# Patient Record
Sex: Male | Born: 1965 | Race: Black or African American | Hispanic: No | Smoking: Current some day smoker
Health system: Southern US, Community
[De-identification: ages and names within clinical notes are randomized; demographics above are authoritative.]

## PROBLEM LIST (undated history)

## (undated) DIAGNOSIS — F4310 Post-traumatic stress disorder, unspecified: Secondary | ICD-10-CM

## (undated) DIAGNOSIS — G8929 Other chronic pain: Secondary | ICD-10-CM

## (undated) DIAGNOSIS — I10 Essential (primary) hypertension: Secondary | ICD-10-CM

## (undated) DIAGNOSIS — M549 Dorsalgia, unspecified: Secondary | ICD-10-CM

## (undated) DIAGNOSIS — E119 Type 2 diabetes mellitus without complications: Secondary | ICD-10-CM

## (undated) HISTORY — PX: SHOULDER SURGERY: SHX246

## (undated) HISTORY — PX: KNEE SURGERY: SHX244

## (undated) HISTORY — PX: APPENDECTOMY: SHX54

---

## 2001-12-03 ENCOUNTER — Encounter: Payer: Self-pay | Admitting: Internal Medicine

## 2001-12-03 ENCOUNTER — Ambulatory Visit (HOSPITAL_COMMUNITY): Admission: RE | Admit: 2001-12-03 | Discharge: 2001-12-03 | Payer: Self-pay | Admitting: Internal Medicine

## 2005-09-06 ENCOUNTER — Emergency Department (HOSPITAL_COMMUNITY): Admission: EM | Admit: 2005-09-06 | Discharge: 2005-09-06 | Payer: Self-pay | Admitting: Emergency Medicine

## 2005-11-13 ENCOUNTER — Ambulatory Visit: Payer: Self-pay | Admitting: Cardiology

## 2005-11-16 ENCOUNTER — Ambulatory Visit: Payer: Self-pay

## 2005-11-16 ENCOUNTER — Ambulatory Visit: Payer: Self-pay | Admitting: Cardiology

## 2005-12-01 ENCOUNTER — Emergency Department (HOSPITAL_COMMUNITY): Admission: EM | Admit: 2005-12-01 | Discharge: 2005-12-01 | Payer: Self-pay | Admitting: Emergency Medicine

## 2005-12-20 ENCOUNTER — Ambulatory Visit: Payer: Self-pay | Admitting: Cardiology

## 2007-07-23 ENCOUNTER — Emergency Department (HOSPITAL_COMMUNITY): Admission: EM | Admit: 2007-07-23 | Discharge: 2007-07-23 | Payer: Self-pay | Admitting: Emergency Medicine

## 2009-06-18 ENCOUNTER — Emergency Department (HOSPITAL_COMMUNITY)
Admission: EM | Admit: 2009-06-18 | Discharge: 2009-06-18 | Payer: Self-pay | Source: Home / Self Care | Admitting: Emergency Medicine

## 2009-06-22 ENCOUNTER — Emergency Department (HOSPITAL_COMMUNITY)
Admission: EM | Admit: 2009-06-22 | Discharge: 2009-06-22 | Payer: Self-pay | Source: Home / Self Care | Admitting: Emergency Medicine

## 2009-08-19 ENCOUNTER — Emergency Department (HOSPITAL_COMMUNITY): Admission: EM | Admit: 2009-08-19 | Discharge: 2009-08-19 | Payer: Self-pay | Admitting: Emergency Medicine

## 2010-08-19 NOTE — Assessment & Plan Note (Signed)
[facility]                              CARDIOLOGY OFFICE NOTE   NAME:[name], [name]                      MRN:          [id number]  DATE:11/13/2005                            DOB:          03-30-1966    REFERRING PHYSICIAN:  Kirk Ruths, MD   REASON FOR VISIT:  Palpitations and chest pain.   HISTORY OF PRESENT ILLNESS:  Ryan [name] is a 45 year old male, with a  reported long-standing history of hypertension and diet-controlled  hyperlipidemia, who was seen in the past by Dr. Royetta Asal back in 1997.  He  was evaluated at that time with hypertension and premature ventricular  complexes and underwent an echocardiogram demonstrating normal systolic  function with no left ventricular hypertrophy, no valvular abnormalities  beyond mild systolic anterior motion of the mitral cord apparatus.  He also  underwent a stress test at that time which based on a letter written by Dr.  Royetta Asal suggested no significant abnormalities, although I do not see the  formal report.   Ryan [name] states that he has been experiencing a variety of symptoms  including sharp pains in his chest, typically left sided and sometimes  radiating around to the shoulder blade and to the lower part of his neck.  These symptoms are sporadic and typically last no more than five minutes.  Also noted is a sense of a forceful heart beat as if his heart is coming  out of the chest.  They sometimes feel up into his neck and is also  sporadic.  This may occur approximately once a week and is not associated  with any dizziness or syncope.  In addition, he has experienced some  dyspnea, general feeling of breathlessness although not necessarily with  exertion.  He thought this last symptom may have been due to the hot weather  recently.  He is referred to discuss evaluation again today.  His  electrocardiogram at rest showed sinus rhythm with nonspecific T-wave  changes.   ALLERGIES:   No known drug allergies.   PRESENT MEDICATIONS:  Hydrochlorothiazide/triamterene 50/75 mg p.o. q.d.,  potassium chloride 20 mEq p.o. q.d., Captopril 25 mg p.o. t.i.d., felodipine  10 mg p.o. q.d.   REVIEW OF SYSTEMS:  As described in the present illness.  Otherwise  negative.   FAMILY HISTORY:  Significant for cardiovascular disease including heart  attack in the patient's mother who died at age 43.   PAST MEDICAL HISTORY:  As outlined above.  Additional history includes  arthroscopic surgery on bilateral knees in 1980s to 1970s, appendectomy in  1985 and right shoulder arthroscopy in 1996.   SOCIAL HISTORY:  The patient is married and has one child.  He has a history  of tobacco use of one half pack per day for 15 years.  He drinks caffeinated  beverages, up to four a day.  He works as a Engineer, [employer].   PHYSICAL EXAMINATION:  VITAL SIGNS:  Blood pressure 130/90, heart rate 75.  Weight 180 pounds.  GENERAL:  This is a well-nourished-appearing male in no acute distress.  HEENT:  Conjunctivae and lids are normal.  Oropharynx is clear.  NECK:  Supple without elevated jugular venous pressure and without bruits.  No thyromegaly is noted.  LUNGS:  Clear without labored breathing.  CARDIAC:  Regular rate and rhythm without loud murmur, pericardial rub or S3  gallop.  No ectopic beats noted.  No obvious mid systolic click.  ABDOMEN:  Soft with no hepatomegaly or bruits.  Bowel sounds are present.  EXTREMITIES:  No significant pitting edema.  Distal pulses are 2+.   IMPRESSION/RECOMMENDATIONS:  1. Chest pain, atypical in description, in a 45 year old male with ongoing      tobacco use, hypertension and family history of cardiovascular disease.      He has not undergone specific cardiac testing over the last 10 years.      I plan a followup exercise Myoview and will review this with him      subsequently in the office.  2. Description of palpitations as outlined.  This has been  a problem in      the past and apparently associated with PVCs.  I will provide a 30-      day event recorder and try to capture any specific dysrhythmias      associated with symptoms.  3. Further plans to follow.                                Jonelle Sidle, MD    SGM/MedQ  DD:  11/13/2005  DT:  11/14/2005  Job #:  [id number]   cc:   Kirk Ruths, MD

## 2010-08-19 NOTE — Assessment & Plan Note (Signed)
[facility]                              CARDIOLOGY OFFICE NOTE   NAME:Saulter, DANILE TRIER                      MRN:          [id number]  DATE:12/20/2005                            DOB:          1965/11/15    PRIMARY CARE PHYSICIAN:  Kirk Ruths, M.D.   REASON FOR VISIT:  Follow-up cardiac testing.   HISTORY OF PRESENT ILLNESS:  I saw Mr. Rosevear back in August.  His history  is outlined in my previous note.  He comes in the office today stating that  overall he has actually felt much better since his last visit.  His event  recorder did not reveal any significant arrhythmias or even ectopy for that  matter and his exercise Myoview revealed ejection fraction of 51% with no  wall motion abnormalities and no evidence of scar or ischemia.  I reviewed  these with him today and he was reassured.  I have generally recommended  that he continue with the strategy of observation and risk factor  modification.  I recommended that he continue to see Dr. Regino Schultze for follow  up of his blood pressure.   ALLERGIES:  No known drug allergies.   PRESENT MEDICATIONS:  Captopril, felodipine, hydrochlorothiazide/triamterene  and potassium.   REVIEW OF SYSTEMS:  As described in history of present illness.   PHYSICAL EXAMINATION:  Blood pressure 144/91, heart rate 87, weight 177  pounds, otherwise no changes since last examination.   IMPRESSION/RECOMMENDATIONS:  1. Reassuring cardiac evaluation including a low-risk exercise Myoview and      no clearly documented dysrhythmias or ectopy by event recording which      suggests a strategy of general risk factor modification.  Mr. Delo      will continue to follow up with Dr. Regino Schultze and we can see him back as      needed.  2. No further cardiac testing planned at this point.                                Jonelle Sidle, MD    SGM/MedQ  DD:  12/20/2005  DT:  12/21/2005  Job #:  [id number]   cc:   Kirk Ruths, M.D.

## 2010-12-27 LAB — BASIC METABOLIC PANEL
BUN: 8
Calcium: 8.9
Creatinine, Ser: 1.2
GFR calc Af Amer: >60
GFR calc non Af Amer: >60

## 2013-11-02 ENCOUNTER — Encounter (HOSPITAL_COMMUNITY): Payer: Self-pay | Admitting: Emergency Medicine

## 2013-11-02 ENCOUNTER — Emergency Department (HOSPITAL_COMMUNITY)
Admission: EM | Admit: 2013-11-02 | Discharge: 2013-11-02 | Disposition: A | Discharge status: Home / Self Care | Payer: No Typology Code available for payment source | Attending: Emergency Medicine | Admitting: Emergency Medicine

## 2013-11-02 ENCOUNTER — Emergency Department (HOSPITAL_COMMUNITY): Payer: No Typology Code available for payment source

## 2013-11-02 DIAGNOSIS — Z79899 Other long term (current) drug therapy: Secondary | ICD-10-CM | POA: Insufficient documentation

## 2013-11-02 DIAGNOSIS — S199XXA Unspecified injury of neck, initial encounter: Secondary | ICD-10-CM

## 2013-11-02 DIAGNOSIS — S0993XA Unspecified injury of face, initial encounter: Secondary | ICD-10-CM | POA: Insufficient documentation

## 2013-11-02 DIAGNOSIS — F17200 Nicotine dependence, unspecified, uncomplicated: Secondary | ICD-10-CM | POA: Insufficient documentation

## 2013-11-02 DIAGNOSIS — IMO0002 Reserved for concepts with insufficient information to code with codable children: Secondary | ICD-10-CM | POA: Insufficient documentation

## 2013-11-02 DIAGNOSIS — Y92410 Unspecified street and highway as the place of occurrence of the external cause: Secondary | ICD-10-CM | POA: Insufficient documentation

## 2013-11-02 DIAGNOSIS — Y9389 Activity, other specified: Secondary | ICD-10-CM | POA: Insufficient documentation

## 2013-11-02 DIAGNOSIS — I10 Essential (primary) hypertension: Secondary | ICD-10-CM | POA: Insufficient documentation

## 2013-11-02 DIAGNOSIS — Z23 Encounter for immunization: Secondary | ICD-10-CM | POA: Insufficient documentation

## 2013-11-02 DIAGNOSIS — T07XXXA Unspecified multiple injuries, initial encounter: Secondary | ICD-10-CM

## 2013-11-02 HISTORY — DX: Essential (primary) hypertension: I10

## 2013-11-02 MED ORDER — TETRACAINE HCL 0.5 % OP SOLN
2.0000 [drp] | Freq: Once | OPHTHALMIC | Status: AC
  Administered 2013-11-02: 2 [drp] via OPHTHALMIC
  Filled 2013-11-02: qty 2

## 2013-11-02 MED ORDER — IBUPROFEN 800 MG PO TABS
800.0000 mg | ORAL_TABLET | Freq: Once | ORAL | Status: AC
  Administered 2013-11-02: 800 mg via ORAL
  Filled 2013-11-02: qty 1

## 2013-11-02 MED ORDER — FLUORESCEIN SODIUM 1 MG OP STRP
ORAL_STRIP | OPHTHALMIC | Status: AC
  Administered 2013-11-02: 04:00:00
  Filled 2013-11-02: qty 1

## 2013-11-02 MED ORDER — TETANUS-DIPHTH-ACELL PERTUSSIS 5-2.5-18.5 LF-MCG/0.5 IM SUSP
0.5000 mL | Freq: Once | INTRAMUSCULAR | Status: AC
  Administered 2013-11-02: 0.5 mL via INTRAMUSCULAR
  Filled 2013-11-02: qty 0.5
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
## 2013-11-02 NOTE — Discharge Instructions (Signed)

## 2013-11-05 ENCOUNTER — Encounter (HOSPITAL_COMMUNITY): Payer: Self-pay | Admitting: Emergency Medicine

## 2013-11-05 ENCOUNTER — Emergency Department (HOSPITAL_COMMUNITY)
Admission: EM | Admit: 2013-11-05 | Discharge: 2013-11-05 | Disposition: A | Discharge status: Home / Self Care | Payer: No Typology Code available for payment source | Attending: Emergency Medicine | Admitting: Emergency Medicine

## 2013-11-05 DIAGNOSIS — Y9389 Activity, other specified: Secondary | ICD-10-CM | POA: Insufficient documentation

## 2013-11-05 DIAGNOSIS — I10 Essential (primary) hypertension: Secondary | ICD-10-CM | POA: Insufficient documentation

## 2013-11-05 DIAGNOSIS — Z79899 Other long term (current) drug therapy: Secondary | ICD-10-CM | POA: Insufficient documentation

## 2013-11-05 DIAGNOSIS — F419 Anxiety disorder, unspecified: Secondary | ICD-10-CM

## 2013-11-05 DIAGNOSIS — Y92410 Unspecified street and highway as the place of occurrence of the external cause: Secondary | ICD-10-CM | POA: Insufficient documentation

## 2013-11-05 DIAGNOSIS — IMO0002 Reserved for concepts with insufficient information to code with codable children: Secondary | ICD-10-CM | POA: Insufficient documentation

## 2013-11-05 DIAGNOSIS — F411 Generalized anxiety disorder: Secondary | ICD-10-CM | POA: Insufficient documentation

## 2013-11-05 DIAGNOSIS — F17200 Nicotine dependence, unspecified, uncomplicated: Secondary | ICD-10-CM | POA: Insufficient documentation

## 2013-11-05 MED ORDER — OLANZAPINE 5 MG PO TBDP
5.0000 mg | ORAL_TABLET | Freq: Every day | ORAL | Status: DC
  Filled 2013-11-05: qty 1

## 2013-11-05 MED ORDER — OLANZAPINE 10 MG PO TABS: ORAL_TABLET | ORAL | Status: DC

## 2013-11-05 MED ORDER — PRAZOSIN HCL 1 MG PO CAPS: 1.0000 mg | ORAL_CAPSULE | Freq: Every day | ORAL | Status: DC

## 2013-11-05 NOTE — BH Assessment (Signed)
[facility] Assessment Progress Note Spoke with Dr. Estell HarpinZammit who requests telepsych because he wants medication recommendations and does not think pt needs IP treatment.  Renata Capriceonrad, NP, was made aware, and will contact Dr. Estell HarpinZammit.
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
## 2013-11-05 NOTE — Discharge Instructions (Signed)
[facility],  [phone]
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
## 2013-11-11 ENCOUNTER — Encounter (HOSPITAL_COMMUNITY): Payer: Self-pay | Admitting: Emergency Medicine

## 2013-11-11 ENCOUNTER — Emergency Department (HOSPITAL_COMMUNITY)
Admission: EM | Admit: 2013-11-11 | Discharge: 2013-11-11 | Disposition: A | Discharge status: Home / Self Care | Payer: No Typology Code available for payment source | Attending: Emergency Medicine | Admitting: Emergency Medicine

## 2013-11-11 DIAGNOSIS — F411 Generalized anxiety disorder: Secondary | ICD-10-CM | POA: Diagnosis present

## 2013-11-11 DIAGNOSIS — I10 Essential (primary) hypertension: Secondary | ICD-10-CM | POA: Insufficient documentation

## 2013-11-11 DIAGNOSIS — IMO0002 Reserved for concepts with insufficient information to code with codable children: Secondary | ICD-10-CM | POA: Diagnosis not present

## 2013-11-11 DIAGNOSIS — Z79899 Other long term (current) drug therapy: Secondary | ICD-10-CM | POA: Diagnosis not present

## 2013-11-11 DIAGNOSIS — F419 Anxiety disorder, unspecified: Secondary | ICD-10-CM

## 2013-11-11 DIAGNOSIS — F17200 Nicotine dependence, unspecified, uncomplicated: Secondary | ICD-10-CM | POA: Diagnosis not present

## 2013-11-11 MED ORDER — ALPRAZOLAM 0.5 MG PO TABS: 0.5000 mg | ORAL_TABLET | Freq: Every evening | ORAL | Status: DC | PRN

## 2013-11-11 NOTE — Discharge Instructions (Signed)
Follow up with [facility] or another counselor by Monday 8/17,
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
## 2013-11-12 NOTE — ED Provider Notes (Signed)
CSN: [id number]     Arrival date & time 11/11/13  1325 History   First MD Initiated Contact with Patient 11/11/13 1406     Chief Complaint  Patient presents with  . Anxiety     (Consider location/radiation/quality/duration/timing/severity/associated sxs/prior Treatment) Patient is a 48 y.o. male presenting with anxiety. The history is provided by the patient (the pt is here for another work note for anxiety .  he cannot drive and therefore cannot work he is to see pysc in a week.).  Anxiety This is a recurrent problem. The current episode started more than 2 days ago. The problem occurs constantly. The problem has not changed since onset.Pertinent negatives include no chest pain, no abdominal pain and no headaches. Nothing aggravates the symptoms. Nothing relieves the symptoms.    Past Medical History  Diagnosis Date  . Hypertension    Past Surgical History  Procedure Laterality Date  . Knee surgery    . Appendectomy    . Shoulder surgery     No family history on file. History  Substance Use Topics  . Smoking status: Current Every Day Smoker -- 0.50 packs/day  . Smokeless tobacco: Not on file  . Alcohol Use: No     Comment: occasional     Review of Systems  Constitutional: Negative for appetite change and fatigue.  HENT: Negative for congestion, ear discharge and sinus pressure.   Eyes: Negative for discharge.  Respiratory: Negative for cough.   Cardiovascular: Negative for chest pain.  Gastrointestinal: Negative for abdominal pain and diarrhea.  Genitourinary: Negative for frequency and hematuria.  Musculoskeletal: Negative for back pain.  Skin: Negative for rash.  Neurological: Negative for seizures and headaches.  Psychiatric/Behavioral: Positive for agitation. Negative for hallucinations.      Allergies  Review of patient's allergies indicates no known allergies.  Home Medications   Prior to Admission medications   Medication Sig Start Date End Date Taking?  Authorizing Provider  amLODipine (NORVASC) 10 MG tablet Take 10 mg by mouth daily.   Yes Historical Provider, MD  Aspirin-Acetaminophen (GOODY BODY PAIN) 500-325 MG PACK Take 1 Package by mouth daily as needed (back pain).   Yes Historical Provider, MD  captopril (CAPOTEN) 12.5 MG tablet Take 12.5 mg by mouth 2 (two) times daily.    Yes Historical Provider, MD  metoprolol tartrate (LOPRESSOR) 25 MG tablet Take 12.5 mg by mouth 2 (two) times daily.    Yes Historical Provider, MD  OLANZapine (ZYPREXA) 10 MG tablet Take one every 8 hours for anxiety 11/05/13  Yes Benny Lennert, MD  prazosin (MINIPRESS) 1 MG capsule Take 1 capsule (1 mg total) by mouth at bedtime. 11/05/13  Yes Benny Lennert, MD  ALPRAZolam Prudy Feeler) 0.5 MG tablet Take 1 tablet (0.5 mg total) by mouth at bedtime as needed for sleep. 11/11/13   Benny Lennert, MD   BP 165/95  Pulse 71  Temp(Src) 99.2 F (37.3 C) (Oral)  Resp 16  Ht 5\' 6"  (1.676 m)  Wt 203 lb (92.08 kg)  BMI 32.78 kg/m2  SpO2 99% Physical Exam  Constitutional: He is oriented to person, place, and time. He appears well-developed.  HENT:  Head: Normocephalic.  Eyes: Conjunctivae and EOM are normal. No scleral icterus.  Neck: Neck supple. No thyromegaly present.  Cardiovascular: Normal rate and regular rhythm.  Exam reveals no gallop and no friction rub.   No murmur heard. Pulmonary/Chest: No stridor. He has no wheezes. He has no rales. He exhibits no tenderness.  Abdominal: He exhibits no distension. There is no tenderness. There is no rebound.  Musculoskeletal: Normal range of motion. He exhibits no edema.  Lymphadenopathy:    He has no cervical adenopathy.  Neurological: He is oriented to person, place, and time. He exhibits normal muscle tone. Coordination normal.  Skin: No rash noted. No erythema.  Psychiatric:  anxious    ED Course  Procedures (including critical care time) Labs Review Labs Reviewed - No data to display  Imaging Review No  results found.   EKG Interpretation None      MDM   Final diagnoses:  Anxiety         Benny LennertJoseph L Madisson Kulaga, MD 11/12/13 1546

## 2013-11-13 ENCOUNTER — Encounter (HOSPITAL_COMMUNITY): Payer: Self-pay | Admitting: Emergency Medicine

## 2013-11-13 ENCOUNTER — Emergency Department (HOSPITAL_COMMUNITY)
Admission: EM | Admit: 2013-11-13 | Discharge: 2013-11-13 | Disposition: A | Discharge status: Home / Self Care | Payer: No Typology Code available for payment source | Attending: Emergency Medicine | Admitting: Emergency Medicine

## 2013-11-13 DIAGNOSIS — I10 Essential (primary) hypertension: Secondary | ICD-10-CM | POA: Diagnosis not present

## 2013-11-13 DIAGNOSIS — Y9389 Activity, other specified: Secondary | ICD-10-CM | POA: Diagnosis not present

## 2013-11-13 DIAGNOSIS — Z7982 Long term (current) use of aspirin: Secondary | ICD-10-CM | POA: Diagnosis not present

## 2013-11-13 DIAGNOSIS — Y92410 Unspecified street and highway as the place of occurrence of the external cause: Secondary | ICD-10-CM | POA: Diagnosis not present

## 2013-11-13 DIAGNOSIS — F4322 Adjustment disorder with anxiety: Secondary | ICD-10-CM

## 2013-11-13 DIAGNOSIS — Z79899 Other long term (current) drug therapy: Secondary | ICD-10-CM | POA: Insufficient documentation

## 2013-11-13 DIAGNOSIS — F411 Generalized anxiety disorder: Secondary | ICD-10-CM | POA: Diagnosis present

## 2013-11-13 DIAGNOSIS — F419 Anxiety disorder, unspecified: Secondary | ICD-10-CM

## 2013-11-13 DIAGNOSIS — F17200 Nicotine dependence, unspecified, uncomplicated: Secondary | ICD-10-CM | POA: Insufficient documentation
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
## 2013-11-13 NOTE — ED Provider Notes (Signed)
Medical screening examination/treatment/procedure(s) were performed by non-physician practitioner and as supervising physician I was immediately available for consultation/collaboration.   EKG Interpretation None        Lyanne CoKevin M Indiah Heyden, MD 11/13/13 [phone]

## 2013-11-13 NOTE — Discharge Instructions (Signed)
Call for a follow up appointment with a Family or Primary Care Provider.  Use resources that Dr. Ladona Ridgelaylor provided.  Return if Symptoms worsen.   Take medication as prescribed.

## 2013-11-13 NOTE — Consult Note (Signed)
Review of Systems   Constitutional: Negative.    HENT: Negative.    Eyes: Negative.    Respiratory: Negative.    Cardiovascular: Negative.    Gastrointestinal: Negative.    Genitourinary: Negative.    Musculoskeletal: Negative.    Skin: Negative.    Neurological: Negative.    Endo/Heme/Allergies: Negative.    Psychiatric/Behavioral: The patient is nervous/anxious.
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
## 2013-11-13 NOTE — [facility] Suicide Risk Assessment (Signed)
Suicide Risk Assessment  Discharge Assessment     Demographic Factors:  Male  Total Time spent with patient: 30 minutes  Psychiatric Specialty Exam:     Blood pressure 155/92, pulse 74, temperature 98.9 F (37.2 C), temperature source Oral, resp. rate 18, SpO2 100.00%.There is no weight on file to calculate BMI.  General Appearance: Well Groomed  Patent attorneyye Contact::  Good  Speech:  Clear and Coherent  Volume:  Normal  Mood:  Anxious  Affect:  Appropriate  Thought Process:  Coherent and Logical  Orientation:  Full (Time, Place, and Person)  Thought Content:  Negative  Suicidal Thoughts:  No  Homicidal Thoughts:  No  Memory:  Immediate;   Good Recent;   Good Remote;   Good  Judgement:  Good  Insight:  Good  Psychomotor Activity:  Normal  Concentration:  Good  Recall:  Good  Fund of Knowledge:Good  Language: Good  Akathisia:  Negative  Handed:  Right  AIMS (if indicated):     Assets:  Communication Skills Desire for Improvement Financial Resources/Insurance Housing Intimacy Leisure Time Physical Health Resilience Social Support Talents/Skills Transportation Vocational/Educational  Sleep:       Musculoskeletal: Strength & Muscle Tone: within normal limits Gait & Station: normal Patient leans: N/A   Mental Status Per Nursing Assessment::   On Admission:     Current Mental Status by Physician: NA  Loss Factors: NA  Historical Factors: NA  Risk Reduction Factors:   NA  Continued Clinical Symptoms:  none  Cognitive Features That Contribute To Risk:  none    Suicide Risk:  Minimal: No identifiable suicidal ideation.  Patients presenting with no risk factors but with morbid ruminations; may be classified as minimal risk based on the severity of the depressive symptoms  Discharge Diagnoses:   AXIS I:  Adjustment Disorder with Anxiety AXIS II:  Deferred AXIS III:   Past Medical History  Diagnosis Date  . Hypertension    AXIS IV:  situational  issue AXIS V:  61-70 mild symptoms  Plan Of Care/Follow-up recommendations:  Activity:  resume usual activity Diet:  resume usual diet  Is patient on multiple antipsychotic therapies at discharge:  No   Has Patient had three or more failed trials of antipsychotic monotherapy by history:  No  Recommended Plan for Multiple Antipsychotic Therapies: NA    TAYLOR,GERALD D 11/13/2013, 1:08 PM

## 2015-01-16 ENCOUNTER — Encounter (HOSPITAL_COMMUNITY): Payer: Self-pay | Admitting: Emergency Medicine

## 2015-01-16 ENCOUNTER — Emergency Department (HOSPITAL_COMMUNITY)
Admission: EM | Admit: 2015-01-16 | Discharge: 2015-01-16 | Disposition: A | Discharge status: Home / Self Care | Payer: Non-veteran care | Attending: Emergency Medicine | Admitting: Emergency Medicine

## 2015-01-16 ENCOUNTER — Emergency Department (HOSPITAL_COMMUNITY): Payer: Non-veteran care

## 2015-01-16 DIAGNOSIS — I10 Essential (primary) hypertension: Secondary | ICD-10-CM | POA: Diagnosis not present

## 2015-01-16 DIAGNOSIS — Z87891 Personal history of nicotine dependence: Secondary | ICD-10-CM | POA: Insufficient documentation

## 2015-01-16 DIAGNOSIS — N1330 Unspecified hydronephrosis: Secondary | ICD-10-CM | POA: Insufficient documentation

## 2015-01-16 DIAGNOSIS — N132 Hydronephrosis with renal and ureteral calculous obstruction: Secondary | ICD-10-CM

## 2015-01-16 DIAGNOSIS — Z79899 Other long term (current) drug therapy: Secondary | ICD-10-CM | POA: Insufficient documentation

## 2015-01-16 DIAGNOSIS — R109 Unspecified abdominal pain: Secondary | ICD-10-CM

## 2015-01-16 DIAGNOSIS — N201 Calculus of ureter: Secondary | ICD-10-CM | POA: Insufficient documentation

## 2015-01-16 LAB — URINALYSIS, ROUTINE W REFLEX MICROSCOPIC
Bilirubin Urine: NEGATIVE
GLUCOSE, UA: NEGATIVE mg/dL
Ketones, ur: NEGATIVE mg/dL
LEUKOCYTES UA: NEGATIVE
Nitrite: NEGATIVE
Protein, ur: NEGATIVE mg/dL
Urobilinogen, UA: 0.2 mg/dL (ref 0.0–1.0)
pH: 5.5 (ref 5.0–8.0)

## 2015-01-16 LAB — CBC WITH DIFFERENTIAL/PLATELET
Basophils Absolute: 0.1 10*3/uL (ref 0.0–0.1)
Basophils Relative: 1 %
Eosinophils Absolute: 0.2 10*3/uL (ref 0.0–0.7)
Eosinophils Relative: 2 %
HEMATOCRIT: 45.9 % (ref 39.0–52.0)
Hemoglobin: 16.2 g/dL (ref 13.0–17.0)
LYMPHS ABS: 2.6 10*3/uL (ref 0.7–4.0)
LYMPHS PCT: 21 %
MCH: 33.1 pg (ref 26.0–34.0)
MCHC: 35.3 g/dL (ref 30.0–36.0)
MCV: 93.9 fL (ref 78.0–100.0)
MONOS PCT: 5 %
Monocytes Absolute: 0.6 10*3/uL (ref 0.1–1.0)
NEUTROS ABS: 8.6 10*3/uL — AB (ref 1.7–7.7)
Neutrophils Relative %: 71 %
Platelets: 326 10*3/uL (ref 150–400)
RBC: 4.89 MIL/uL (ref 4.22–5.81)
RDW: 13.6 % (ref 11.5–15.5)
WBC: 12.1 10*3/uL — ABNORMAL HIGH (ref 4.0–10.5)

## 2015-01-16 LAB — COMPREHENSIVE METABOLIC PANEL
ALBUMIN: 4.3 g/dL (ref 3.5–5.0)
ALT: 23 U/L (ref 17–63)
AST: 20 U/L (ref 15–41)
Alkaline Phosphatase: 55 U/L (ref 38–126)
Anion gap: 9 (ref 5–15)
BILIRUBIN TOTAL: 0.5 mg/dL (ref 0.3–1.2)
BUN: 10 mg/dL (ref 6–20)
CHLORIDE: 104 mmol/L (ref 101–111)
CO2: 25 mmol/L (ref 22–32)
Calcium: 9.1 mg/dL (ref 8.9–10.3)
Creatinine, Ser: 1.15 mg/dL (ref 0.61–1.24)
GFR calc Af Amer: >60 mL/min (ref >60)
GLUCOSE: 151 mg/dL — AB (ref 65–99)
POTASSIUM: 3.5 mmol/L (ref 3.5–5.1)
Sodium: 138 mmol/L (ref 135–145)
Total Protein: 7.9 g/dL (ref 6.5–8.1)

## 2015-01-16 LAB — URINE MICROSCOPIC-ADD ON

## 2015-01-16 MED ORDER — KETOROLAC TROMETHAMINE 30 MG/ML IJ SOLN
30.0000 mg | Freq: Once | INTRAMUSCULAR | Status: AC
  Administered 2015-01-16: 30 mg via INTRAVENOUS
  Filled 2015-01-16: qty 1

## 2015-01-16 MED ORDER — FENTANYL CITRATE (PF) 100 MCG/2ML IJ SOLN
50.0000 ug | Freq: Once | INTRAMUSCULAR | Status: AC
  Administered 2015-01-16: 50 ug via INTRAVENOUS
  Filled 2015-01-16: qty 2

## 2015-01-16 MED ORDER — HYDROMORPHONE HCL 1 MG/ML IJ SOLN
1.0000 mg | Freq: Once | INTRAMUSCULAR | Status: AC
  Administered 2015-01-16: 1 mg via INTRAVENOUS
  Filled 2015-01-16: qty 1

## 2015-01-16 MED ORDER — OXYCODONE-ACETAMINOPHEN 5-325 MG PO TABS: 1.0000 | ORAL_TABLET | Freq: Four times a day (QID) | ORAL | Status: DC | PRN

## 2015-01-16 MED ORDER — ONDANSETRON 8 MG PO TBDP: 8.0000 mg | ORAL_TABLET | Freq: Three times a day (TID) | ORAL | Status: DC | PRN

## 2015-01-16 MED ORDER — ONDANSETRON HCL 4 MG/2ML IJ SOLN
4.0000 mg | Freq: Once | INTRAMUSCULAR | Status: AC
  Administered 2015-01-16: 4 mg via INTRAVENOUS
  Filled 2015-01-16: qty 2

## 2015-01-16 MED ORDER — ONDANSETRON HCL 4 MG/2ML IJ SOLN: 4.0000 mg | Freq: Once | INTRAMUSCULAR | Status: DC

## 2015-01-16 NOTE — ED Provider Notes (Signed)
CSN: [id number]     Arrival date & time 01/16/15  4098 History   First MD Initiated Contact with Patient 01/16/15 0735     Chief Complaint  Patient presents with  . Flank Pain     (Consider location/radiation/quality/duration/timing/severity/associated sxs/prior Treatment) Patient is a 49 y.o. male presenting with flank pain. The history is provided by the patient.  Flank Pain This is a new problem. Pertinent negatives include no chest pain, no abdominal pain, no headaches and no shortness of breath.   patient presents with pain in his left flank. Began last night. Pain is somewhat dull and will come and go somewhat. Worse with certain positions but also at times position does not seem to change the pain. No fevers. He's had frequent small amounts of urination. No testicle pain. He's had nausea vomiting with the pain. He states that he feels better after the vomiting. No abdominal pain, only pain in his flank. He said he initially thought the pain was due to the chicken gizzards he ate from [facility]. No perineal pain. No trauma.   Past Medical History  Diagnosis Date  . Hypertension    Past Surgical History  Procedure Laterality Date  . Knee surgery    . Appendectomy    . Shoulder surgery     History reviewed. No pertinent family history. Social History  Substance Use Topics  . Smoking status: Former Smoker -- 0.50 packs/day    Types: Cigarettes    Quit date: 12/17/2014  . Smokeless tobacco: Never Used  . Alcohol Use: No     Comment: occasional     Review of Systems  Constitutional: Negative for activity change and appetite change.  Eyes: Negative for pain.  Respiratory: Negative for chest tightness and shortness of breath.   Cardiovascular: Negative for chest pain and leg swelling.  Gastrointestinal: Positive for nausea and vomiting. Negative for abdominal pain and diarrhea.  Genitourinary: Positive for frequency and flank pain.  Musculoskeletal: Negative for back pain and  neck stiffness.  Skin: Negative for rash.  Neurological: Negative for weakness, numbness and headaches.  Psychiatric/Behavioral: Negative for behavioral problems.      Allergies  Review of patient's allergies indicates no known allergies.  Home Medications   Prior to Admission medications   Medication Sig Start Date End Date Taking? Authorizing Provider  ALPRAZolam Prudy Feeler) 0.5 MG tablet Take 1 tablet (0.5 mg total) by mouth at bedtime as needed for sleep. 11/11/13  Yes Bethann Berkshire, MD  amLODipine (NORVASC) 10 MG tablet Take 10 mg by mouth daily.   Yes Historical Provider, MD  Aspirin-Acetaminophen (GOODY BODY PAIN) 500-325 MG PACK Take 1 Package by mouth daily as needed (back pain).   Yes Historical Provider, MD  captopril (CAPOTEN) 12.5 MG tablet Take 12.5 mg by mouth 2 (two) times daily.    Yes Historical Provider, MD  metoprolol tartrate (LOPRESSOR) 25 MG tablet Take 12.5 mg by mouth 2 (two) times daily.    Yes Historical Provider, MD  OLANZapine (ZYPREXA) 10 MG tablet Take one every 8 hours for anxiety Patient not taking: Reported on 01/16/2015 11/05/13   Bethann Berkshire, MD  ondansetron (ZOFRAN-ODT) 8 MG disintegrating tablet Take 1 tablet (8 mg total) by mouth every 8 (eight) hours as needed for nausea or vomiting. 01/16/15   Benjiman Core, MD  oxyCODONE-acetaminophen (PERCOCET/ROXICET) 5-325 MG tablet Take 1-2 tablets by mouth every 6 (six) hours as needed for severe pain. 01/16/15   Benjiman Core, MD   BP 145/86 mmHg  Pulse 75  Temp(Src) 98.6 F (37 C) (Oral)  Resp 16  Ht 5\' 6"  (1.676 m)  Wt 181 lb (82.101 kg)  BMI 29.23 kg/m2  SpO2 94% Physical Exam  Constitutional: He is oriented to person, place, and time. He appears well-developed and well-nourished.  HENT:  Head: Normocephalic and atraumatic.  Eyes: EOM are normal. Pupils are equal, round, and reactive to light.  Neck: Normal range of motion. Neck supple.  Cardiovascular: Normal rate, regular rhythm and normal  heart sounds.   No murmur heard. Pulmonary/Chest: Effort normal and breath sounds normal.  Abdominal: Soft. Bowel sounds are normal. He exhibits no distension and no mass. There is no tenderness. There is no rebound and no guarding.  Genitourinary:  No CVA tenderness.  Musculoskeletal: Normal range of motion. He exhibits no edema.  Neurological: He is alert and oriented to person, place, and time. No cranial nerve deficit.  Skin: Skin is warm and dry.  Psychiatric: He has a normal mood and affect.  Nursing note and vitals reviewed.   ED Course  Procedures (including critical care time) Labs Review Labs Reviewed  URINALYSIS, ROUTINE W REFLEX MICROSCOPIC (NOT AT [facility]) - Abnormal; Notable for the following:    APPearance HAZY (*)    Specific Gravity, Urine >1.030 (*)    Hgb urine dipstick LARGE (*)    All other components within normal limits  CBC WITH DIFFERENTIAL/PLATELET - Abnormal; Notable for the following:    WBC 12.1 (*)    Neutro Abs 8.6 (*)    All other components within normal limits  COMPREHENSIVE METABOLIC PANEL - Abnormal; Notable for the following:    Glucose, Bld 151 (*)    All other components within normal limits  URINE MICROSCOPIC-ADD ON - Abnormal; Notable for the following:    Squamous Epithelial / LPF FEW (*)    Bacteria, UA FEW (*)    All other components within normal limits    Imaging Review Ct Renal Stone Study  01/16/2015  CLINICAL DATA:  Left flank pain, prior appendectomy EXAM: CT ABDOMEN AND PELVIS WITHOUT CONTRAST TECHNIQUE: Multidetector CT imaging of the abdomen and pelvis was performed following the standard protocol without IV contrast. COMPARISON:  None. FINDINGS: Lower chest:  Lung bases are clear. Hepatobiliary: Unenhanced liver is unremarkable. Gallbladder is unremarkable. No intrahepatic or extrahepatic ductal dilatation. Pancreas: Within normal limits. Spleen: Within normal limits. Adrenals/Urinary Tract: Adrenal glands are within normal  limits. Kidneys are within normal limits. 3 mm distal left ureteral calculus at the UVJ (series 2/image 34). Mild left hydronephrosis. Bladder is underdistended but unremarkable. Stomach/Bowel: Stomach is notable for a small hiatal hernia. No evidence of bowel obstruction. Prior appendectomy. Vascular/Lymphatic: Atherosclerotic calcifications of the abdominal aorta and branch vessels. No suspicious abdominopelvic lymphadenopathy. Reproductive: Prostate is unremarkable. Other: No abdominopelvic ascites. Small fat containing periumbilical hernia. Small fat containing left inguinal hernia. Musculoskeletal: Degenerative changes of the visualized thoracolumbar spine. IMPRESSION: 3 mm distal left ureteral calculus at the UVJ. Mild left hydronephrosis. Electronically Signed   By: Charline BillsSriyesh  Krishnan M.D.   On: 01/16/2015 09:25   I have personally reviewed and evaluated these images and lab results as part of my medical decision-making.   EKG Interpretation None      MDM   Final diagnoses:  Flank pain  Ureteral stone with hydronephrosis   patient with flank pain. 3 mm distal left ureteral calculus. No infection. Will discharge home.    Benjiman CoreNathan Gil Ingwersen, MD 01/16/15 [id number]

## 2015-01-16 NOTE — ED Notes (Signed)
Patient c/o left flank pain with nausea and vomiting that started last night. Per patient feeling of need to urinate but only able to go in small amounts. Denies any pain or blood in urine. No hx of kidney stones. Denies any fevers.

## 2015-01-16 NOTE — Discharge Instructions (Signed)
Kidney Stones °Kidney stones (urolithiasis) are deposits that form inside your kidneys. The intense pain is caused by the stone moving through the urinary tract. When the stone moves, the ureter goes into spasm around the stone. The stone is usually passed in the urine.  °CAUSES  °· A disorder that makes certain neck glands produce too much parathyroid hormone (primary hyperparathyroidism). °· A buildup of uric acid crystals, similar to gout in your joints. °· Narrowing (stricture) of the ureter. °· A kidney obstruction present at birth (congenital obstruction). °· Previous surgery on the kidney or ureters. °· Numerous kidney infections. °SYMPTOMS  °· Feeling sick to your stomach (nauseous). °· Throwing up (vomiting). °· Blood in the urine (hematuria). °· Pain that usually spreads (radiates) to the groin. °· Frequency or urgency of urination. °DIAGNOSIS  °· Taking a history and physical exam. °· Blood or urine tests. °· CT scan. °· Occasionally, an examination of the inside of the urinary bladder (cystoscopy) is performed. °TREATMENT  °· Observation. °· Increasing your fluid intake. °· Extracorporeal shock wave lithotripsy--This is a noninvasive procedure that uses shock waves to break up kidney stones. °· Surgery may be needed if you have severe pain or persistent obstruction. There are various surgical procedures. Most of the procedures are performed with the use of small instruments. Only small incisions are needed to accommodate these instruments, so recovery time is minimized. °The size, location, and chemical composition are all important variables that will determine the proper choice of action for you. Talk to your health care provider to better understand your situation so that you will minimize the risk of injury to yourself and your kidney.  °HOME CARE INSTRUCTIONS  °· Drink enough water and fluids to keep your urine clear or pale yellow. This will help you to pass the stone or stone fragments. °· Strain  all urine through the provided strainer. Keep all particulate matter and stones for your health care provider to see. The stone causing the pain may be as small as a grain of salt. It is very important to use the strainer each and every time you pass your urine. The collection of your stone will allow your health care provider to analyze it and verify that a stone has actually passed. The stone analysis will often identify what you can do to reduce the incidence of recurrences. °· Only take over-the-counter or prescription medicines for pain, discomfort, or fever as directed by your health care provider. °· Keep all follow-up visits as told by your health care provider. This is important. °· Get follow-up X-rays if required. The absence of pain does not always mean that the stone has passed. It may have only stopped moving. If the urine remains completely obstructed, it can cause loss of kidney function or even complete destruction of the kidney. It is your responsibility to make sure X-rays and follow-ups are completed. Ultrasounds of the kidney can show blockages and the status of the kidney. Ultrasounds are not associated with any radiation and can be performed easily in a matter of minutes. °· Make changes to your daily diet as told by your health care provider. You may be told to: °¨ Limit the amount of salt that you eat. °¨ Eat 5 or more servings of fruits and vegetables each day. °¨ Limit the amount of meat, poultry, fish, and eggs that you eat. °· Collect a 24-hour urine sample as told by your health care provider. You may need to collect another urine sample every 6-12   months. °SEEK MEDICAL CARE IF: °· You experience pain that is progressive and unresponsive to any pain medicine you have been prescribed. °SEEK IMMEDIATE MEDICAL CARE IF:  °· Pain cannot be controlled with the prescribed medicine. °· You have a fever or shaking chills. °· The severity or intensity of pain increases over 18 hours and is not  relieved by pain medicine. °· You develop a new onset of abdominal pain. °· You feel faint or pass out. °· You are unable to urinate. °  °This information is not intended to replace advice given to you by your health care provider. Make sure you discuss any questions you have with your health care provider. °  °Document Released: 03/20/2005 Document Revised: 12/09/2014 Document Reviewed: 08/21/2012 °Elsevier Interactive Patient Education ©2016 [employer] ° °

## 2015-01-16 NOTE — ED Notes (Signed)
Patient with no complaints at this time. Respirations even and unlabored. Skin warm/dry. Discharge instructions reviewed with patient at this time. Patient given opportunity to voice concerns/ask questions. IV removed per policy and band-aid applied to site. Patient discharged at this time and left Emergency Department with steady gait.  

## 2015-01-16 NOTE — ED Notes (Signed)
MD at bedside. 

## 2016-05-19 ENCOUNTER — Encounter (HOSPITAL_COMMUNITY): Payer: Self-pay | Admitting: Emergency Medicine

## 2016-05-19 ENCOUNTER — Emergency Department (HOSPITAL_COMMUNITY): Payer: Non-veteran care

## 2016-05-19 ENCOUNTER — Observation Stay (HOSPITAL_COMMUNITY)
Admission: EM | Admit: 2016-05-19 | Discharge: 2016-05-20 | Disposition: A | Discharge status: Home / Self Care | Payer: Non-veteran care | Attending: Family Medicine | Admitting: Family Medicine

## 2016-05-19 ENCOUNTER — Observation Stay (HOSPITAL_BASED_OUTPATIENT_CLINIC_OR_DEPARTMENT_OTHER): Payer: Non-veteran care

## 2016-05-19 DIAGNOSIS — I10 Essential (primary) hypertension: Secondary | ICD-10-CM | POA: Diagnosis not present

## 2016-05-19 DIAGNOSIS — Z720 Tobacco use: Secondary | ICD-10-CM

## 2016-05-19 DIAGNOSIS — R079 Chest pain, unspecified: Secondary | ICD-10-CM

## 2016-05-19 DIAGNOSIS — Z87891 Personal history of nicotine dependence: Secondary | ICD-10-CM | POA: Insufficient documentation

## 2016-05-19 DIAGNOSIS — R072 Precordial pain: Secondary | ICD-10-CM | POA: Diagnosis present

## 2016-05-19 DIAGNOSIS — E876 Hypokalemia: Secondary | ICD-10-CM

## 2016-05-19 DIAGNOSIS — F17200 Nicotine dependence, unspecified, uncomplicated: Secondary | ICD-10-CM

## 2016-05-19 DIAGNOSIS — R0789 Other chest pain: Principal | ICD-10-CM | POA: Insufficient documentation

## 2016-05-19 DIAGNOSIS — Z79899 Other long term (current) drug therapy: Secondary | ICD-10-CM | POA: Diagnosis not present

## 2016-05-19 DIAGNOSIS — F17210 Nicotine dependence, cigarettes, uncomplicated: Secondary | ICD-10-CM

## 2016-05-19 HISTORY — DX: Post-traumatic stress disorder, unspecified: F43.10

## 2016-05-19 HISTORY — DX: Other chronic pain: G89.29

## 2016-05-19 HISTORY — DX: Dorsalgia, unspecified: M54.9

## 2016-05-19 LAB — COMPREHENSIVE METABOLIC PANEL
ALT: 19 U/L (ref 17–63)
AST: 16 U/L (ref 15–41)
Albumin: 3.9 g/dL (ref 3.5–5.0)
Alkaline Phosphatase: 56 U/L (ref 38–126)
Anion gap: 9 (ref 5–15)
BUN: 12 mg/dL (ref 6–20)
CHLORIDE: 104 mmol/L (ref 101–111)
CO2: 24 mmol/L (ref 22–32)
Calcium: 9.1 mg/dL (ref 8.9–10.3)
Creatinine, Ser: 0.94 mg/dL (ref 0.61–1.24)
GFR calc Af Amer: >60 mL/min (ref >60)
Glucose, Bld: 140 mg/dL — ABNORMAL HIGH (ref 65–99)
POTASSIUM: 3.3 mmol/L — AB (ref 3.5–5.1)
Sodium: 137 mmol/L (ref 135–145)
Total Bilirubin: 0.5 mg/dL (ref 0.3–1.2)
Total Protein: 7.3 g/dL (ref 6.5–8.1)

## 2016-05-19 LAB — CBC WITH DIFFERENTIAL/PLATELET
Basophils Absolute: 0.1 10*3/uL (ref 0.0–0.1)
Basophils Relative: 1 %
EOS PCT: 5 %
Eosinophils Absolute: 0.5 10*3/uL (ref 0.0–0.7)
HEMATOCRIT: 44.4 % (ref 39.0–52.0)
HEMOGLOBIN: 15.7 g/dL (ref 13.0–17.0)
LYMPHS ABS: 3.6 10*3/uL (ref 0.7–4.0)
LYMPHS PCT: 34 %
MCH: 32.7 pg (ref 26.0–34.0)
MCHC: 35.4 g/dL (ref 30.0–36.0)
MCV: 92.5 fL (ref 78.0–100.0)
Monocytes Absolute: 0.7 10*3/uL (ref 0.1–1.0)
Monocytes Relative: 6 %
NEUTROS ABS: 5.8 10*3/uL (ref 1.7–7.7)
NEUTROS PCT: 54 %
Platelets: 304 10*3/uL (ref 150–400)
RBC: 4.8 MIL/uL (ref 4.22–5.81)
RDW: 13.8 % (ref 11.5–15.5)
WBC: 10.6 10*3/uL — AB (ref 4.0–10.5)

## 2016-05-19 LAB — ECHOCARDIOGRAM COMPLETE
Height: 66 in
Weight: 3100.55 oz

## 2016-05-19 LAB — TROPONIN I: Troponin I: <0.03 ng/mL (ref <0.03)

## 2016-05-19 MED ORDER — LOSARTAN POTASSIUM 50 MG PO TABS
100.0000 mg | ORAL_TABLET | Freq: Every day | ORAL | Status: DC
  Administered 2016-05-19 – 2016-05-20 (×2): 100 mg via ORAL
  Filled 2016-05-19 (×2): qty 2

## 2016-05-19 MED ORDER — ACETAMINOPHEN 325 MG PO TABS
650.0000 mg | ORAL_TABLET | ORAL | Status: DC | PRN
  Administered 2016-05-19: 650 mg via ORAL
  Filled 2016-05-19: qty 2

## 2016-05-19 MED ORDER — ONDANSETRON HCL 4 MG/2ML IJ SOLN: 4.0000 mg | Freq: Four times a day (QID) | INTRAMUSCULAR | Status: DC | PRN

## 2016-05-19 MED ORDER — NITROGLYCERIN 2 % TD OINT
1.0000 [in_us] | TOPICAL_OINTMENT | Freq: Once | TRANSDERMAL | Status: AC
  Administered 2016-05-19: 1 [in_us] via TOPICAL
  Filled 2016-05-19: qty 1

## 2016-05-19 MED ORDER — AMLODIPINE BESYLATE 5 MG PO TABS
10.0000 mg | ORAL_TABLET | Freq: Every day | ORAL | Status: DC
  Administered 2016-05-19 – 2016-05-20 (×2): 10 mg via ORAL
  Filled 2016-05-19 (×2): qty 2

## 2016-05-19 MED ORDER — ASPIRIN 325 MG PO TABS
325.0000 mg | ORAL_TABLET | Freq: Once | ORAL | Status: AC
  Administered 2016-05-19: 325 mg via ORAL
  Filled 2016-05-19: qty 1

## 2016-05-19 MED ORDER — MORPHINE SULFATE (PF) 4 MG/ML IV SOLN: 4.0000 mg | Freq: Once | INTRAVENOUS | Status: DC

## 2016-05-19 MED ORDER — POTASSIUM CHLORIDE CRYS ER 20 MEQ PO TBCR
40.0000 meq | EXTENDED_RELEASE_TABLET | Freq: Once | ORAL | Status: AC
  Administered 2016-05-19: 40 meq via ORAL
  Filled 2016-05-19: qty 2

## 2016-05-19 MED ORDER — GI COCKTAIL ~~LOC~~: 30.0000 mL | Freq: Four times a day (QID) | ORAL | Status: DC | PRN

## 2016-05-19 MED ORDER — ASPIRIN EC 325 MG PO TBEC
325.0000 mg | DELAYED_RELEASE_TABLET | Freq: Every day | ORAL | Status: DC
  Administered 2016-05-20: 325 mg via ORAL
  Filled 2016-05-19: qty 1

## 2016-05-19 MED ORDER — MORPHINE SULFATE (PF) 2 MG/ML IV SOLN
INTRAVENOUS | Status: AC
  Administered 2016-05-19: 4 mg via INTRAVENOUS
  Filled 2016-05-19: qty 2

## 2016-05-19 MED ORDER — NITROGLYCERIN 0.4 MG SL SUBL: 0.4000 mg | SUBLINGUAL_TABLET | SUBLINGUAL | Status: DC | PRN

## 2016-05-19 MED ORDER — METOPROLOL TARTRATE 25 MG PO TABS
12.5000 mg | ORAL_TABLET | Freq: Two times a day (BID) | ORAL | Status: DC
  Administered 2016-05-19 – 2016-05-20 (×3): 12.5 mg via ORAL
  Filled 2016-05-19 (×3): qty 1

## 2016-05-19 NOTE — Progress Notes (Signed)
*  PRELIMINARY RESULTS* Echocardiogram 2D Echocardiogram has been performed.  Ryan [name], Ryan [name] 05/19/2016, 3:24 PM

## 2016-05-19 NOTE — ED Triage Notes (Signed)
PT c/o intermittent left sided chest pain with SOB for over the past week.

## 2016-05-19 NOTE — ED Provider Notes (Signed)
[facility] DEPT Provider Note   CSN: [id number] Arrival date & time: 05/19/16  0705     History   Chief Complaint Chief Complaint  Patient presents with  . Chest Pain    HPI Ryan [name] DOBEK is a 51 y.o. male.  Patient complains of chest pain. Last couple days WORSE WITH EXERTION. BOTH OF THE PARENTS OF THIS PATIENT HAD CORONARY ARTERY DISEASE IN HIS SISTER HAS A PACEMAKER   The history is provided by the patient. No language interpreter was used.  Chest Pain   This is a new problem. The current episode started 2 days ago. The problem occurs daily. The problem has not changed since onset.The pain is associated with exertion. The pain is present in the substernal region. The pain is at a severity of 6/10. The pain is moderate. Pertinent negatives include no abdominal pain, no back pain, no cough and no headaches.  Pertinent negatives for past medical history include no seizures.    Past Medical History:  Diagnosis Date  . Chronic back pain   . Hypertension   . PTSD (post-traumatic stress disorder)     Patient Active Problem List   Diagnosis Date Noted  . Chest pain 05/19/2016    Past Surgical History:  Procedure Laterality Date  . APPENDECTOMY    . KNEE SURGERY    . SHOULDER SURGERY         Home Medications    Prior to Admission medications   Medication Sig Start Date End Date Taking? Authorizing Provider  ALPRAZolam Prudy Feeler) 0.5 MG tablet Take 1 tablet (0.5 mg total) by mouth at bedtime as needed for sleep. 11/11/13   Bethann Berkshire, MD  amLODipine (NORVASC) 10 MG tablet Take 10 mg by mouth daily.    Historical Provider, MD  Aspirin-Acetaminophen (GOODY BODY PAIN) 500-325 MG PACK Take 1 Package by mouth daily as needed (back pain).    Historical Provider, MD  captopril (CAPOTEN) 12.5 MG tablet Take 12.5 mg by mouth 2 (two) times daily.     Historical Provider, MD  metoprolol tartrate (LOPRESSOR) 25 MG tablet Take 12.5 mg by mouth 2 (two) times daily.      Historical Provider, MD  OLANZapine (ZYPREXA) 10 MG tablet Take one every 8 hours for anxiety Patient not taking: Reported on 01/16/2015 11/05/13   Bethann Berkshire, MD  ondansetron (ZOFRAN-ODT) 8 MG disintegrating tablet Take 1 tablet (8 mg total) by mouth every 8 (eight) hours as needed for nausea or vomiting. 01/16/15   Benjiman Core, MD  oxyCODONE-acetaminophen (PERCOCET/ROXICET) 5-325 MG tablet Take 1-2 tablets by mouth every 6 (six) hours as needed for severe pain. 01/16/15   Benjiman Core, MD    Family History Family History  Problem Relation Age of Onset  . Heart attack Mother 43    Deceased   . Heart attack Father 19    Decesed   . CAD Sister 3    Stents/ICD    Social History Social History  Substance Use Topics  . Smoking status: Former Smoker    Packs/day: 0.50    Types: Cigarettes    Quit date: 12/17/2014  . Smokeless tobacco: Former Neurosurgeon    Quit date: 01/17/2016     Comment: Smokes on occasion   . Alcohol use No     Comment: occasional      Allergies   Patient has no known allergies.   Review of Systems Review of Systems  Constitutional: Negative for appetite change and fatigue.  HENT: Negative for  congestion, ear discharge and sinus pressure.   Eyes: Negative for discharge.  Respiratory: Negative for cough.   Cardiovascular: Positive for chest pain.  Gastrointestinal: Negative for abdominal pain and diarrhea.  Genitourinary: Negative for frequency and hematuria.  Musculoskeletal: Negative for back pain.  Skin: Negative for rash.  Neurological: Negative for seizures and headaches.  Psychiatric/Behavioral: Negative for hallucinations.     Physical Exam Updated Vital Signs BP 160/94   Pulse 90   Temp 98.6 F (37 C) (Oral)   Resp 16   Ht 5\' 6"  (1.676 m)   Wt 191 lb (86.6 kg)   SpO2 100%   BMI 30.83 kg/m   Physical Exam  Constitutional: He is oriented to person, place, and time. He appears well-developed.  HENT:  Head: Normocephalic.    Eyes: Conjunctivae and EOM are normal. No scleral icterus.  Neck: Neck supple. No thyromegaly present.  Cardiovascular: Normal rate and regular rhythm.  Exam reveals no gallop and no friction rub.   No murmur heard. Pulmonary/Chest: No stridor. He has no wheezes. He has no rales. He exhibits no tenderness.  Abdominal: He exhibits no distension. There is no tenderness. There is no rebound.  Musculoskeletal: Normal range of motion. He exhibits no edema.  Lymphadenopathy:    He has no cervical adenopathy.  Neurological: He is oriented to person, place, and time. He exhibits normal muscle tone. Coordination normal.  Skin: No rash noted. No erythema.  Psychiatric: He has a normal mood and affect. His behavior is normal.     ED Treatments / Results  Labs (all labs ordered are listed, but only abnormal results are displayed) Labs Reviewed  COMPREHENSIVE METABOLIC PANEL - Abnormal; Notable for the following:       Result Value   Potassium 3.3 (*)    Glucose, Bld 140 (*)    All other components within normal limits  CBC WITH DIFFERENTIAL/PLATELET - Abnormal; Notable for the following:    WBC 10.6 (*)    All other components within normal limits  TROPONIN I  TROPONIN I    EKG  EKG Interpretation  Date/Time:  Friday May 19 2016 07:10:57 EST Ventricular Rate:  92 PR Interval:    QRS Duration: 94 QT Interval:  332 QTC Calculation: 411 R Axis:   41 Text Interpretation:  Sinus rhythm Nonspecific T abnormalities, diffuse leads Confirmed by Asanti Craigo  MD, Yama Nielson [phone]) on 05/19/2016 7:35:17 AM       Radiology Dg Chest 2 View  Result Date: 05/19/2016 CLINICAL DATA:  Chest pain. EXAM: CHEST  2 VIEW COMPARISON:  12/01/2005. FINDINGS: Mediastinum hilar structures normal. Lungs are clear of acute infiltrates stable punctate nodular density, most likely calcified granuloma noted the left mid lung. Heart size normal. No pleural effusion or pneumothorax. IMPRESSION: No acute  cardiopulmonary disease. Electronically Signed   By: Maisie Fushomas  Register   On: 05/19/2016 07:39    Procedures Procedures (including critical care time)  Medications Ordered in ED Medications  morphine 4 MG/ML injection 4 mg (4 mg Intravenous Not Given 05/19/16 0833)  nitroGLYCERIN (NITROSTAT) SL tablet 0.4 mg (not administered)  aspirin tablet 325 mg (325 mg Oral Given 05/19/16 0833)  morphine 2 MG/ML injection (4 mg Intravenous Given 05/19/16 0832)  potassium chloride SA (K-DUR,KLOR-CON) CR tablet 40 mEq (40 mEq Oral Given 05/19/16 0921)  nitroGLYCERIN (NITROGLYN) 2 % ointment 1 inch (1 inch Topical Given 05/19/16 0938)     Initial Impression / Assessment and Plan / ED Course  I have reviewed the  triage vital signs and the nursing notes.  Pertinent labs & imaging results that were available during my care of the patient were reviewed by me and considered in my medical decision making (see chart for details).     Patient with chest pain and EKG changes. Normal troponin. He will be admitted by internal medicine and cardiology will consult  Final Clinical Impressions(s) / ED Diagnoses   Final diagnoses:  Other chest pain    New Prescriptions New Prescriptions   No medications on file     Bethann Berkshire, MD 05/19/16 1025

## 2016-05-19 NOTE — Consult Note (Signed)
CARDIOLOGY CONSULT NOTE   Patient ID: Ryan [name] MRN: [id number] DOB/AGE: [birth year] 51 y.o.  Admit Date: 05/19/2016 Referring Physician: Estell Harpin MD Primary Physician: PROVIDER NOT IN SYSTEM Consulting Cardiologist: Dina Rich MD Primary Cardiologist New Reason for Consultation: Chest Pain  Clinical Summary Ryan [name] is a 51 y.o.male with known history of hypertension, PTSD, chronic back problems, normally seen at the [facility] in [place] for health concerns. Has been seen by cardiology in the remote past (1997), and had stress test which was negative.This was completed in the setting of recurrent chest pain at that time. Ryan [name] reports that Ryan [name] has been compliant with Ryan [name] mediation regimen. Quit smoking 5 months ago, but states Ryan [name] smoked a cigarette yesterday.   Patient reports recurrent chest discomfort with palpitations on and off yesterday, lasting 10-13 seconds at a time, while at rest.. Some radiation to Ryan [name] left shoulder. No associated with dyspnea, diaphoresis, nausea, or weakness. Pain continued to recur Ryan [name] came to ER for evaluation.   On arrival, BP 167/98, HR 92,O2 sat 97%. Pertinent labs: Potassium 3.3, glucose 140, creatinine 0.94. WBC 10.6.  No anemia. Troponin 0.03. EKG NSR with non-specific T-wave inversion infero/lateral. CXR was negative for acute disease process, CHF or pneumonia.  Ryan [name] was treated with ASA, morphine, potassium replacement and NTG paste was placed topically. Ryan [name] states Ryan [name] is comfortable now, without only rare instances of brief chest pain on the left.   No Known Allergies  Medications Scheduled Medications: .  morphine injection  4 mg Intravenous Once    Infusions:   PRN Medications: nitroGLYCERIN   Past Medical History:  Diagnosis Date  . Hypertension     Past Surgical History:  Procedure Laterality Date  . APPENDECTOMY    . KNEE SURGERY    . SHOULDER SURGERY      Family History  Problem Relation Age of Onset  . Heart attack  Mother 73    Deceased   . Heart attack Father 89    Decesed   . CAD Sister 44    Stents/ICD    Social History Ryan [name] reports that Ryan [name] quit smoking about 17 months ago. Ryan [name] smoking use included Cigarettes. Ryan [name] smoked 0.50 packs per day. Ryan [name] quit smokeless tobacco use about 4 months ago. Ryan [name] reports that Ryan [name] does not drink alcohol.  Review of Systems Complete review of systems are found to be negative unless outlined in H&P above.  Physical Examination Blood pressure 160/94, pulse 90, temperature 98.6 F (37 C), temperature source Oral, resp. rate 16, height 5\' 6"  (1.676 m), weight 191 lb (86.6 kg), SpO2 100 %. No intake or output data in the 24 hours ending 05/19/16 1004  Telemetry: NSR  GEN: No acute distress HEENT: Conjunctiva and lids normal, oropharynx clear with moist mucosa. Neck: Supple, no elevated JVP or carotid bruits, no thyromegaly. Lungs: Clear to auscultation, nonlabored breathing at rest. Cardiac: Regular rate and rhythm soft 1/6, systolic murmur, no pericardial rub. Abdomen: Soft, nontender, no hepatomegaly, bowel sounds present, no guarding or rebound. Extremities: No pitting edema, distal pulses 2+. Skin: Warm and dry. Musculoskeletal: No kyphosis. Neuropsychiatric: Alert and oriented x3, affect grossly appropriate.  Prior Cardiac Testing/Procedures Remote stress test in 1997.   Lab Results  Basic Metabolic Panel:  Recent Labs Lab 05/19/16 0716  NA 137  K 3.3*  CL 104  CO2 24  GLUCOSE 140*  BUN 12  CREATININE 0.94  CALCIUM 9.1    Liver Function Tests:  Recent Labs Lab  05/19/16 0716  AST 16  ALT 19  ALKPHOS 56  BILITOT 0.5  PROT 7.3  ALBUMIN 3.9    CBC:  Recent Labs Lab 05/19/16 0716  WBC 10.6*  NEUTROABS 5.8  HGB 15.7  HCT 44.4  MCV 92.5  PLT 304    Cardiac Enzymes:  Recent Labs Lab 05/19/16 0716  TROPONINI <0.03    Radiology: Dg Chest 2 View  Result Date: 05/19/2016 CLINICAL DATA:  Chest pain. EXAM:  CHEST  2 VIEW COMPARISON:  12/01/2005. FINDINGS: Mediastinum hilar structures normal. Lungs are clear of acute infiltrates stable punctate nodular density, most likely calcified granuloma noted the left mid lung. Heart size normal. No pleural effusion or pneumothorax. IMPRESSION: No acute cardiopulmonary disease. Electronically Signed   By: Maisie Fushomas  Register   On: 05/19/2016 07:39     ECG: NSR with diffuse non-specific T-wave abnormalities in the infero/lateral leads.    Impression and Recommendations  1. Chest Pain: Typical and atypical features, lasting 10-13 seconds, not associated with diaphoreses,nausea or dyspnea, occurring at rest. Troponin is negative X 1. EKG abnormal with T-wave abnormalities in the infero/lateral leads. Ryan [name] is comfortable now with NTG and ASA. Heart Score of 5.  CVRF: Age, Male, FH premature CAD, Hypertension, unknown cholesterol status,smoking. Will need to have stress test for diagnostic/prognostic purposes. If troponin increases, may consider transfer for cardiac cath. Will discuss with Dr. Wyline MoodBranch need to transfer or keep here for observation. Echo for LV function.   2. Hypokalemia: Replaced with po. Review of prior labs reveal hypokalemia in the past. Ryan [name] denies nausea or excessive body fluid loss. Rweview of mediations does not have him on diuretics at home.   3. Hypertension: On 3 medications at home for BP control, captopril,metoprolol, and amlodipine. Would restart these.   4. Tobacco abuse: States Ryan [name] quit smoking 5 months ago but will occasionally smoke one with friends.   5. PTSD: [facility] [place]   6. Chronic back pain from injury: Has applied for disability.    Signed: Bettey MareKathryn M. Lawrence NP [facility]  05/19/2016, 10:04 AM Co-Sign MD  Patient seen and discussed with NP Lyman BishopLawrence, I agree with her documentation above. 51 yo male history of HTN presents with chest pain. Ryan [name] reports intermittent chest pain on and off for nearly 20  years. Prior stress test in 1997 per Ryan [name] report was negative. Typically gets a 5/10 dull or stabbing pain left chest, lasts just a few seconds. Some associated SOB, often can be associated with palpitations. Not related to food. Typically not positional, but last night seemed to have some effect with position.   Reports over the last 2 days increase in frequency and severity of Ryan [name] chronic chest pain symptoms. Particularly last night while lying down. Noted with positioning symptoms would somewhat improve. Notes some recent DOE as well.   CAD risk factors: HTN, tobacco history. Family history: mother MI age 51, father MI in Ryan [name] 8370s. Reports Ryan [name] sister has an ICD.     K 3.3 Cr 0.94 BUN 12 Hgb 15.7 Plt 304  Trop neg x 1 EKG SR, TWIs inferior and lateral precordial leads (no prior EKG to compare.    51 yo male presents with chest pain. Ryan [name] is early in Ryan [name] initial workup. Would cycle EKG and enzymes overnight, we will obtain an echo today. Pending initial workup consider whether ischemic testing is required, either noninvasive or invasive.   Dina RichJonathan Taris Galindo MD

## 2016-05-19 NOTE — H&P (Signed)
History and Physical  Ryan [name]:[id number] DOB: [birth year] DOA: 05/19/2016  PCP: PROVIDER NOT IN SYSTEM  Patient coming from: home  Chief Complaint: chest pain  HPI:  51 year old man without history of coronary disease presented to the emergency department with complaint of chest pain. Initial evaluation was unremarkable except for depressed T waves inferolaterally and patient was referred for observation and cardiology evaluation.  For the last week or so he has not felt very well. Over the last few days he's developed a sharp intermittent left-sided chest pain which lasts usually a very brief amount of time  and resolves without specific aggravating or alleviating factors. Happens at rest as well as with exertion. Some shortness of breath. No associated nausea, vomiting, left arm, neck or jaw pain. No diaphoresis.  Patient denies any previous history of heart problems. He does have a fairly strong family history for heart disease. He is not very active but does go squirrel hunting at times and does not get unusually short of breath with this. No previous significant chest pain. He reports some time ago he had some chest pain and was evaluated in [place] with stress test which was reportedly unremarkable.  ED Course: Afebrile, vital signs stable, no hypoxia. Treated with aspirin, morphine, nitroglycerin, potassium. Pertinent labs: Potassium 3.3. Remainder CMP unremarkable. Troponins negative thus far. CBC unremarkable. Imaging: CXR independently reviewed: No acute disease.  Review of Systems:  Negative for fever, new visual changes, sore throat, rash, new muscle aches, dysuria, bleeding, n/v/abdominal pain.  Past Medical History:  Diagnosis Date  . Chronic back pain   . Hypertension   . PTSD (post-traumatic stress disorder)     Past Surgical History:  Procedure Laterality Date  . APPENDECTOMY    . KNEE SURGERY Bilateral    arthroscopic  . SHOULDER SURGERY       reports that he quit smoking about 17 months ago. His smoking use included Cigarettes. He smoked 0.50 packs per day. He quit smokeless tobacco use about 4 months ago. He reports that he does not drink alcohol or use drugs. Mobility: Ambulatory  No Known Allergies  Family History  Problem Relation Age of Onset  . Heart attack Mother 31    Deceased   . Heart attack Father 17    Decesed   . CAD Sister 13    Stents/ICD     Prior to Admission medications   Medication Sig Start Date End Date Taking? Authorizing Provider  ALPRAZolam Prudy Feeler) 0.5 MG tablet Take 1 tablet (0.5 mg total) by mouth at bedtime as needed for sleep. 11/11/13  Yes Bethann Berkshire, MD  amLODipine (NORVASC) 10 MG tablet Take 10 mg by mouth daily.   Yes Historical Provider, MD  Aspirin-Acetaminophen (GOODY BODY PAIN) 500-325 MG PACK Take 1 Package by mouth daily as needed (back pain).   Yes Historical Provider, MD  losartan (COZAAR) 100 MG tablet Take 100 mg by mouth daily.   Yes Historical Provider, MD  metoprolol tartrate (LOPRESSOR) 25 MG tablet Take 12.5 mg by mouth 2 (two) times daily.    Yes Historical Provider, MD    Physical Exam: Vitals:   05/19/16 1100 05/19/16 1115 05/19/16 1124 05/19/16 1200  BP: 134/90   (!) 143/90  Pulse: 78 79  78  Resp: 16 19  20   Temp:   98.5 F (36.9 C) 98.3 F (36.8 C)  TempSrc:    Oral  SpO2: 99% 98%  100%  Weight:    87.9 kg (193 lb  12.6 oz)  Height:    5\' 6"  (1.676 m)    Constitutional:  . Appears calm and comfortable Eyes:  . Pupils, irises, lids appear unremarkable. ENMT:  . Grossly normal hearing. Lips and tongue appear unremarkable. Neck:  . No lymphadenopathy or masses. No thyromegaly. Respiratory:  . CTA bilaterally, no w/r/r.  . Respiratory effort normal.  Cardiovascular:  . RRR, no m/r/g . No LE extremity edema   Abdomen:  . Abdomen appears normal; no tenderness or masses . Soft umbilical hernia present Musculoskeletal:  . RUE, LUE, RLE, LLE    o strength and tone normal, no atrophy, no abnormal movements o No tenderness, masses Skin:  . No rashes, lesions, ulcers . palpation of skin: no induration or nodules Psychiatric:  . judgement and insight appear normal . Mental status o Mood, affect appropriate  Wt Readings from Last 3 Encounters:  05/19/16 87.9 kg (193 lb 12.6 oz)  01/16/15 82.1 kg (181 lb)  11/11/13 92.1 kg (203 lb)    I have personally reviewed following labs and imaging studies  Labs on Admission:  CBC:  Recent Labs Lab 05/19/16 0716  WBC 10.6*  NEUTROABS 5.8  HGB 15.7  HCT 44.4  MCV 92.5  PLT 304   Basic Metabolic Panel:  Recent Labs Lab 05/19/16 0716  NA 137  K 3.3*  CL 104  CO2 24  GLUCOSE 140*  BUN 12  CREATININE 0.94  CALCIUM 9.1   Liver Function Tests:  Recent Labs Lab 05/19/16 0716  AST 16  ALT 19  ALKPHOS 56  BILITOT 0.5  PROT 7.3  ALBUMIN 3.9   Cardiac Enzymes:  Recent Labs Lab 05/19/16 0716 05/19/16 1053  TROPONINI <0.03 <0.03    Radiological Exams on Admission: Dg Chest 2 View  Result Date: 05/19/2016 CLINICAL DATA:  Chest pain. EXAM: CHEST  2 VIEW COMPARISON:  12/01/2005. FINDINGS: Mediastinum hilar structures normal. Lungs are clear of acute infiltrates stable punctate nodular density, most likely calcified granuloma noted the left mid lung. Heart size normal. No pleural effusion or pneumothorax. IMPRESSION: No acute cardiopulmonary disease. Electronically Signed   By: Maisie Fushomas  Register   On: 05/19/2016 07:39    EKG: Independently reviewed: Sinus rhythm, inferolateral T-wave inversion. No previous EKG available for comparison.  Principal Problem:   Chest pain Active Problems:   Essential hypertension   Tobacco use disorder   Assessment/Plan 1. Chest pain with typical and atypical features  Risk factors include family history, hypertension, smoking.  Currently stable on telemetry floor.  Cardiology has evaluated: Cycle troponin, obtain  echocardiogram. Further recommendations per cardiology. 2. Hypertension. Stable. 3. Hypokalemia. Replete. 4. Tobacco use disorder. Recommend abstinence. 5. PTSD. Appears stable.   It is my clinical opinion that referral for OBSERVATION is reasonable and necessary in this patient  The aforementioned taken together are felt to place the patient at high risk for further clinical deterioration. However it is anticipated that the patient may be medically stable for discharge from the hospital within 24 to 48 hours.   DVT prophylaxis:SCDs Code Status: full code Family Communication: none  Time spent: 50 minutes  Brendia Sacksaniel Grasiela Jonsson, MD  [facility] Direct contact: [phone] --Via amion app OR  --[email or web address]; password [email or web address]  7PM-7AM contact night coverage as above  05/19/2016, 1:57 PM

## 2016-05-20 DIAGNOSIS — R0789 Other chest pain: Secondary | ICD-10-CM | POA: Diagnosis not present

## 2016-05-20 LAB — TROPONIN I: Troponin I: <0.03 ng/mL (ref <0.03)

## 2016-05-20 LAB — HIV ANTIBODY (ROUTINE TESTING W REFLEX): HIV SCREEN 4TH GENERATION: NONREACTIVE

## 2016-05-20 MED ORDER — ASPIRIN 325 MG PO TBEC: 325.0000 mg | DELAYED_RELEASE_TABLET | Freq: Every day | ORAL | 0 refills | Status: DC

## 2016-05-20 NOTE — Progress Notes (Signed)
Pt's IV catheter removed and intact. Pt's IV site clean dry and intact. Discharge instructions including medications and follow up appointments were reviewed and discussed with patient. Pt verbalized understanding of discharge instructions including medications and follow up appointments. All questions were answered and no further questions at this time. Pt in stable condition and in no acute distress at time of discharge. Pt will be escorted by nurse tech.  

## 2016-05-20 NOTE — Discharge Summary (Signed)
Physician Discharge Summary  Ryan [name]:[id number] DOB: 20-Jul-1965 DOA: 05/19/2016  PCP: PROVIDER NOT IN SYSTEM  Admit date: 05/19/2016 Discharge date: 05/20/2016  Recommendations for Outpatient Follow-up:  1. Chest pain with normal echocardiogram and negative troponins. Outpatient follow-up will be arranged with cardiology in the near future.   Follow-up Information    Dina Rich, MD Follow up.   Specialty:  Cardiology Why:  Office will contact you with appointment. Contact information: [address] [place] [address] [phone]          Discharge Diagnoses:  1. Chest pain. 2. Hypertension 3. Hypokalemia 4. Tobacco use disorder  Discharge Condition: Improved Disposition: Home    Diet recommendation: heart healthy  Filed Weights   05/19/16 0712 05/19/16 1200  Weight: 86.6 kg (191 lb) 87.9 kg (193 lb 12.6 oz)    History of present illness:  51 year old man without history of coronary disease presented to the emergency department with complaint of chest pain. Initial evaluation was unremarkable except for depressed T waves inferolaterally and patient was referred for observation and cardiology evaluation.  Hospital Course:  Patient was observed overnight. Troponins were negative. ACS ruled out. Overall much improved. Telemetry unrevealing (sinus rhythm). Echocardiogram was reassuring. Per cardiology, plan for discharge home with outpatient follow-up (discussed with Dr. Wyline Mood yesterday).  Consultant: Cardiology Procedure: Echocardiogram Study Conclusions  - Left ventricle: The cavity size was normal. Wall thickness was   normal. Systolic function was normal. The estimated ejection   fraction was in the range of 55% to 60%. Wall motion was normal;   there were no regional wall motion abnormalities. Left   ventricular diastolic function parameters were normal. - Aortic valve: Valve area (VTI): 2.87 cm^2. Valve area (Vmax):   2.67 cm^2. -  Technically adequate study.  Today: Subjective: Feels better today. No shortness of breath. No significant chest pain. Objective: Afebrile, vital signs stable. BP 140/81, SPO2 99% on room air, temperature 98.2, respiratory rate 17, heart rate 74. Constitutional. Appears calm, comfortable. Respiratory. Clear to auscultation bilaterally. No wheezes, rales or rhonchi. Normal respiratory effort. Psychiatric. Grossly normal mood and affect. Speech fluent and appropriate  Discharge Instructions  Discharge Instructions    Activity as tolerated - No restrictions    Complete by:  As directed    Diet - low sodium heart healthy    Complete by:  As directed    Discharge instructions    Complete by:  As directed    Call your physician or seek immediate medical attention for chest pain, shortness of breath, left arm pain, neck pain, jaw pain, heavy sweats, vomiting or worsening of condition.     Allergies as of 05/20/2016   No Known Allergies     Medication List    STOP taking these medications   GOODY BODY PAIN 500-325 MG Pack Generic drug:  Aspirin-Acetaminophen     TAKE these medications   ALPRAZolam 0.5 MG tablet Commonly known as:  XANAX Take 1 tablet (0.5 mg total) by mouth at bedtime as needed for sleep.   amLODipine 10 MG tablet Commonly known as:  NORVASC Take 10 mg by mouth daily.   aspirin 325 MG EC tablet Take 1 tablet (325 mg total) by mouth daily. Start taking on:  05/21/2016   losartan 100 MG tablet Commonly known as:  COZAAR Take 100 mg by mouth daily.   metoprolol tartrate 25 MG tablet Commonly known as:  LOPRESSOR Take 12.5 mg by mouth 2 (two) times daily.  No Known Allergies  The results of significant diagnostics from this hospitalization (including imaging, microbiology, ancillary and laboratory) are listed below for reference.    Significant Diagnostic Studies: Dg Chest 2 View  Result Date: 05/19/2016 CLINICAL DATA:  Chest pain. EXAM: CHEST  2  VIEW COMPARISON:  12/01/2005. FINDINGS: Mediastinum hilar structures normal. Lungs are clear of acute infiltrates stable punctate nodular density, most likely calcified granuloma noted the left mid lung. Heart size normal. No pleural effusion or pneumothorax. IMPRESSION: No acute cardiopulmonary disease. Electronically Signed   By: Maisie Fushomas  Register   On: 05/19/2016 07:39    Labs: Basic Metabolic Panel:  Recent Labs Lab 05/19/16 0716  NA 137  K 3.3*  CL 104  CO2 24  GLUCOSE 140*  BUN 12  CREATININE 0.94  CALCIUM 9.1   Liver Function Tests:  Recent Labs Lab 05/19/16 0716  AST 16  ALT 19  ALKPHOS 56  BILITOT 0.5  PROT 7.3  ALBUMIN 3.9   CBC:  Recent Labs Lab 05/19/16 0716  WBC 10.6*  NEUTROABS 5.8  HGB 15.7  HCT 44.4  MCV 92.5  PLT 304   Cardiac Enzymes:  Recent Labs Lab 05/19/16 0716 05/19/16 1053 05/19/16 1702 05/19/16 2341  TROPONINI <0.03 <0.03 <0.03 <0.03    Principal Problem:   Chest pain Active Problems:   Essential hypertension   Tobacco use disorder   Time coordinating discharge: 20 minutes  Signed:  Brendia Sacksaniel Goodrich, MD [facility] 05/20/2016, 1:43 PM

## 2016-05-29 ENCOUNTER — Encounter: Payer: Non-veteran care | Admitting: Adult Health

## 2016-10-03 ENCOUNTER — Encounter (HOSPITAL_COMMUNITY): Payer: Self-pay | Admitting: Emergency Medicine

## 2016-10-03 ENCOUNTER — Emergency Department (HOSPITAL_COMMUNITY)
Admission: EM | Admit: 2016-10-03 | Discharge: 2016-10-03 | Disposition: A | Discharge status: Home / Self Care | Payer: Non-veteran care | Attending: Emergency Medicine | Admitting: Emergency Medicine

## 2016-10-03 ENCOUNTER — Emergency Department (HOSPITAL_COMMUNITY): Payer: Non-veteran care

## 2016-10-03 DIAGNOSIS — Z87891 Personal history of nicotine dependence: Secondary | ICD-10-CM | POA: Insufficient documentation

## 2016-10-03 DIAGNOSIS — R0789 Other chest pain: Secondary | ICD-10-CM

## 2016-10-03 DIAGNOSIS — I10 Essential (primary) hypertension: Secondary | ICD-10-CM | POA: Insufficient documentation

## 2016-10-03 DIAGNOSIS — M62830 Muscle spasm of back: Secondary | ICD-10-CM | POA: Insufficient documentation

## 2016-10-03 DIAGNOSIS — Z7982 Long term (current) use of aspirin: Secondary | ICD-10-CM | POA: Insufficient documentation

## 2016-10-03 DIAGNOSIS — R079 Chest pain, unspecified: Secondary | ICD-10-CM | POA: Diagnosis present

## 2016-10-03 DIAGNOSIS — Z79899 Other long term (current) drug therapy: Secondary | ICD-10-CM | POA: Diagnosis not present

## 2016-10-03 LAB — CBC WITH DIFFERENTIAL/PLATELET
Basophils Absolute: 0.1 10*3/uL (ref 0.0–0.1)
Basophils Relative: 1 %
EOS ABS: 0.7 10*3/uL (ref 0.0–0.7)
EOS PCT: 6 %
HCT: 42.4 % (ref 39.0–52.0)
HEMOGLOBIN: 14.8 g/dL (ref 13.0–17.0)
LYMPHS ABS: 4.5 10*3/uL — AB (ref 0.7–4.0)
LYMPHS PCT: 39 %
MCH: 32.2 pg (ref 26.0–34.0)
MCHC: 34.9 g/dL (ref 30.0–36.0)
MCV: 92.4 fL (ref 78.0–100.0)
MONOS PCT: 6 %
Monocytes Absolute: 0.7 10*3/uL (ref 0.1–1.0)
NEUTROS PCT: 48 %
Neutro Abs: 5.7 10*3/uL (ref 1.7–7.7)
Platelets: 306 10*3/uL (ref 150–400)
RBC: 4.59 MIL/uL (ref 4.22–5.81)
RDW: 13.9 % (ref 11.5–15.5)
WBC: 11.7 10*3/uL — ABNORMAL HIGH (ref 4.0–10.5)

## 2016-10-03 LAB — MAGNESIUM: MAGNESIUM: 1.9 mg/dL (ref 1.7–2.4)

## 2016-10-03 LAB — COMPREHENSIVE METABOLIC PANEL
ALK PHOS: 53 U/L (ref 38–126)
ALT: 20 U/L (ref 17–63)
ANION GAP: 10 (ref 5–15)
AST: 18 U/L (ref 15–41)
Albumin: 3.9 g/dL (ref 3.5–5.0)
BUN: 10 mg/dL (ref 6–20)
CALCIUM: 9.1 mg/dL (ref 8.9–10.3)
CO2: 23 mmol/L (ref 22–32)
CREATININE: 1.12 mg/dL (ref 0.61–1.24)
Chloride: 103 mmol/L (ref 101–111)
Glucose, Bld: 157 mg/dL — ABNORMAL HIGH (ref 65–99)
Potassium: 3.3 mmol/L — ABNORMAL LOW (ref 3.5–5.1)
SODIUM: 136 mmol/L (ref 135–145)
TOTAL PROTEIN: 7.3 g/dL (ref 6.5–8.1)
Total Bilirubin: 0.7 mg/dL (ref 0.3–1.2)

## 2016-10-03 LAB — TROPONIN I
TROPONIN I: 0.03 ng/mL — AB (ref <0.03)
Troponin I: <0.03 ng/mL (ref <0.03)

## 2016-10-03 MED ORDER — IBUPROFEN 600 MG PO TABS: 600.0000 mg | ORAL_TABLET | Freq: Four times a day (QID) | ORAL | 0 refills | Status: DC | PRN

## 2016-10-03 MED ORDER — METHOCARBAMOL 500 MG PO TABS: 1000.0000 mg | ORAL_TABLET | Freq: Three times a day (TID) | ORAL | 0 refills | Status: DC | PRN

## 2016-10-03 MED ORDER — POTASSIUM CHLORIDE CRYS ER 20 MEQ PO TBCR
40.0000 meq | EXTENDED_RELEASE_TABLET | Freq: Once | ORAL | Status: AC
  Administered 2016-10-03: 40 meq via ORAL
  Filled 2016-10-03: qty 2

## 2016-10-03 MED ORDER — KETOROLAC TROMETHAMINE 30 MG/ML IJ SOLN
30.0000 mg | Freq: Once | INTRAMUSCULAR | Status: AC
  Administered 2016-10-03: 30 mg via INTRAVENOUS
  Filled 2016-10-03: qty 1

## 2016-10-03 MED ORDER — METHOCARBAMOL 500 MG PO TABS
1000.0000 mg | ORAL_TABLET | Freq: Once | ORAL | Status: AC
  Administered 2016-10-03: 1000 mg via ORAL
  Filled 2016-10-03: qty 2

## 2016-10-03 NOTE — ED Triage Notes (Signed)
PT c/o sharp left sided chest pain intermittently that started this am with left arm/shoulder pain and tingling. PT denies any SOB but states some dizziness this am.

## 2016-10-03 NOTE — ED Provider Notes (Signed)
[facility]-EMER[facility]ENCY DEPT Provider Note   CSN: [id number] Arrival date & time: 10/03/16  0712     History   Chief Complaint Chief Complaint  Patient presents with  . Chest Pain    HPI Ryan [name] is a 51 y.o. male.  HPI Patient presents with left-sided neck and shoulder pain radiating to his left arm. States this was present several days ago and then has improved. Woke up this morning with similar pain. Describes the pain as spasm-like. He has some spasm like pain in the left chest as well. Denies any heavy lifting or trauma. No numbness but does have some tingling sensation down his left arm. Denies cough, fever or chills. No shortness of breath. Hasn't momentary dizziness which is improved. No new lower extremity swelling or pain. No recent extended travel or immobilization. Patient was admitted earlier in the year with negative cardiac workup. States he had negative stress test at the [facility] several months ago. Past Medical History:  Diagnosis Date  . Chronic back pain   . Hypertension   . PTSD (post-traumatic stress disorder)     Patient Active Problem List   Diagnosis Date Noted  . Chest pain 05/19/2016  . Essential hypertension 05/19/2016  . Tobacco use disorder 05/19/2016    Past Surgical History:  Procedure Laterality Date  . APPENDECTOMY    . KNEE SURGERY Bilateral    arthroscopic  . SHOULDER SURGERY         Home Medications    Prior to Admission medications   Medication Sig Start Date End Date Taking? Authorizing Provider  amLODipine (NORVASC) 10 MG tablet Take 10 mg by mouth daily.   Yes [provider]  losartan (COZAAR) 100 MG tablet Take 100 mg by mouth daily.   Yes [provider]  metoprolol tartrate (LOPRESSOR) 25 MG tablet Take 25-50 mg by mouth 2 (two) times daily. 50mg  in the morning, 25mg  at night   Yes [provider]  aspirin EC 325 MG EC tablet Take 1 tablet (325 mg total) by mouth daily. 05/21/16   Standley BrookingGoodrich, Daniel P, MD    ibuprofen (ADVIL,MOTRIN) 600 MG tablet Take 1 tablet (600 mg total) by mouth every 6 (six) hours as needed. 10/03/16   Loren RacerYelverton, Aminta Sakurai, MD  methocarbamol (ROBAXIN) 500 MG tablet Take 2 tablets (1,000 mg total) by mouth every 8 (eight) hours as needed for muscle spasms. 10/03/16   Loren RacerYelverton, Connar Keating, MD    Family History Family History  Problem Relation Age of Onset  . Heart attack Mother [id number]       Deceased   . Heart attack Father 6970       Decesed   . CAD Sister 2557       Stents/ICD    Social History Social History  Substance Use Topics  . Smoking status: Former Smoker    Packs/day: 0.50    Types: Cigarettes    Quit date: 12/17/2014  . Smokeless tobacco: Former NeurosurgeonUser    Quit date: 01/17/2016     Comment: Smokes on occasion   . Alcohol use No     Comment: occasional      Allergies   Patient has no known allergies.   Review of Systems Review of Systems  Constitutional: Negative for chills, fatigue and fever.  Respiratory: Negative for cough and shortness of breath.   Cardiovascular: Positive for chest pain. Negative for palpitations and leg swelling.  Gastrointestinal: Negative for abdominal pain, diarrhea, nausea and vomiting.  Musculoskeletal: Positive for  back pain, myalgias and neck pain. Negative for arthralgias, joint swelling and neck stiffness.  Skin: Negative for rash and wound.  Neurological: Negative for dizziness, weakness, light-headedness, numbness and headaches.  All other systems reviewed and are negative.    Physical Exam Updated Vital Signs BP (!) 151/90 (BP Location: Right Arm)   Pulse 67   Temp 98.9 F (37.2 C) (Oral)   Resp 18   Ht 5\' 5"  (1.651 m)   Wt 86.6 kg (191 lb)   SpO2 97%   BMI 31.78 kg/m   Physical Exam  Constitutional: He is oriented to person, place, and time. He appears well-developed and well-nourished. No distress.  HENT:  Head: Normocephalic and atraumatic.  Mouth/Throat: Oropharynx is clear and moist. No oropharyngeal exudate.   Eyes: EOM are normal. Pupils are equal, round, and reactive to light.  Neck: Normal range of motion. Neck supple.  No meningismus. Patient has left paracervical and left sided trapezius tenderness to palpation.  Cardiovascular: Normal rate and regular rhythm.  Exam reveals no gallop and no friction rub.   No murmur heard. Pulmonary/Chest: Effort normal and breath sounds normal. No respiratory distress. He has no wheezes. He has no rales. He exhibits no tenderness.  Abdominal: Soft. Bowel sounds are normal. There is no tenderness. There is no rebound and no guarding.  Musculoskeletal: Normal range of motion. He exhibits tenderness. He exhibits no edema.  Tenderness to palpation over the left deltoid. Full range of motion of the left deltoid, elbow and wrist. 2+ distal pulses. No lower extremity swelling or asymmetry.  Lymphadenopathy:    He has no cervical adenopathy.  Neurological: He is alert and oriented to person, place, and time.  Patient is alert and oriented x3 with clear, goal oriented speech. Patient has 5/5 motor in all extremities. Sensation is intact to light touch. Patient has a normal gait and walks without assistance.  Skin: Skin is warm and dry. Capillary refill takes less than 2 seconds. No rash noted. He is not diaphoretic. No erythema.  Psychiatric: He has a normal mood and affect. His behavior is normal.  Nursing note and vitals reviewed.    ED Treatments / Results  Labs (all labs ordered are listed, but only abnormal results are displayed) Labs Reviewed  CBC WITH DIFFERENTIAL/PLATELET - Abnormal; Notable for the following:       Result Value   WBC 11.7 (*)    Lymphs Abs 4.5 (*)    All other components within normal limits  COMPREHENSIVE METABOLIC PANEL - Abnormal; Notable for the following:    Potassium 3.3 (*)    Glucose, Bld 157 (*)    All other components within normal limits  TROPONIN I - Abnormal; Notable for the following:    Troponin I 0.03 (*)    All  other components within normal limits  TROPONIN I  MAGNESIUM  TROPONIN I    EKG  EKG Interpretation  Date/Time:  Tuesday October 03 2016 07:19:55 EDT Ventricular Rate:  78 PR Interval:    QRS Duration: 98 QT Interval:  387 QTC Calculation: 441 R Axis:   39 Text Interpretation:  Sinus rhythm Borderline T wave abnormalities Confirmed by Ranae Palms  MD, Eulene Pekar ([id number]) on 10/03/2016 7:26:26 AM       Radiology Dg Chest 2 View  Result Date: 10/03/2016 CLINICAL DATA:  Left-sided chest pain radiating to the left shoulder beginning this morning. EXAM: CHEST  2 VIEW COMPARISON:  05/19/2016 FINDINGS: Heart size is normal. Mediastinal shadows are normal.  The lungs are clear. No bronchial thickening. No infiltrate, mass, effusion or collapse. Pulmonary vascularity is normal. No bony abnormality. IMPRESSION: Normal chest Electronically Signed   By: Paulina Fusi M.D.   On: 10/03/2016 07:50    Procedures Procedures (including critical care time)  Medications Ordered in ED Medications  ketorolac (TORADOL) 30 MG/ML injection 30 mg (30 mg Intravenous Given 10/03/16 0749)  methocarbamol (ROBAXIN) tablet 1,000 mg (1,000 mg Oral Given 10/03/16 0749)  potassium chloride SA (K-DUR,KLOR-CON) CR tablet 40 mEq (40 mEq Oral Given 10/03/16 1013)     Initial Impression / Assessment and Plan / ED Course  I have reviewed the triage vital signs and the nursing notes.  Pertinent labs & imaging results that were available during my care of the patient were reviewed by me and considered in my medical decision making (see chart for details).     Patient's pain appears to be more musculoskeletal versus radicular in nature. Low suspicion for coronary artery disease. EKG without any acute findings. Initial troponin is normal. Will give second troponin to rule out acute injury. Will treat symptomatically. Second troponin was mildly elevated at 0.03. Patient's symptoms have significantly improved. Third troponin drawn and  less than 0.03. Discharge home with cardiology follow-up with the, again this is atypical for coronary artery disease. Return precautions have been given. Final Clinical Impressions(s) / ED Diagnoses   Final diagnoses:  Atypical chest pain  Muscle spasm of back    New Prescriptions Discharge Medication List as of 10/03/2016  2:27 PM    START taking these medications   Details  ibuprofen (ADVIL,MOTRIN) 600 MG tablet Take 1 tablet (600 mg total) by mouth every 6 (six) hours as needed., Starting Tue 10/03/2016, Print    methocarbamol (ROBAXIN) 500 MG tablet Take 2 tablets (1,000 mg total) by mouth every 8 (eight) hours as needed for muscle spasms., Starting Tue 10/03/2016, Print         Loren Racer, MD 10/03/16 [phone]

## 2016-10-03 NOTE — ED Notes (Signed)
CRITICAL VALUE ALERT  Critical Value:  Troponin 0.03  Date & Time Notied:  10/03/16 at 1054  Provider Notified: Ranae PalmsYelverton  Orders Received/Actions taken: MD made aware

## 2016-10-30 ENCOUNTER — Ambulatory Visit: Payer: Non-veteran care | Admitting: Cardiovascular Disease

## 2016-11-07 ENCOUNTER — Encounter: Payer: Self-pay | Admitting: Cardiovascular Disease

## 2016-11-07 ENCOUNTER — Ambulatory Visit (INDEPENDENT_AMBULATORY_CARE_PROVIDER_SITE_OTHER): Payer: Self-pay | Admitting: Cardiovascular Disease

## 2016-11-07 VITALS — BP 130/90 | HR 87 | Ht 66.0 in | Wt 196.0 lb

## 2016-11-07 DIAGNOSIS — Z9289 Personal history of other medical treatment: Secondary | ICD-10-CM

## 2016-11-07 DIAGNOSIS — I10 Essential (primary) hypertension: Secondary | ICD-10-CM

## 2016-11-07 DIAGNOSIS — R079 Chest pain, unspecified: Secondary | ICD-10-CM

## 2016-11-07 MED ORDER — ASPIRIN EC 81 MG PO TBEC: 81.0000 mg | DELAYED_RELEASE_TABLET | Freq: Every day | ORAL | Status: DC

## 2016-11-07 MED ORDER — ISOSORBIDE MONONITRATE ER 30 MG PO TB24: 30.0000 mg | ORAL_TABLET | Freq: Every day | ORAL | 6 refills | Status: DC

## 2016-11-07 NOTE — Progress Notes (Signed)
SUBJECTIVE: The patient is a 51 year old male with a history of tobacco abuse, chronic back pain, and hypertension. This is my first time meeting him. He was hospitalized for chest pain and for break 2018 at which time Dr. Wyline MoodBranch evaluated him.  Echocardiogram at that time demonstrated normal left ventricular systolic function and regional wall motion, LVEF 55-60%. Diastolic function was also normal.  No ischemic evaluation was pursued at that time.  He tells me he ultimately underwent a nuclear stress test at the [facility] in [place] and was told it was "normal". He has occasional chest pains lasting 7-10 seconds. They can occur both at rest and with exertion. He has occasional associated shortness of breath. He has occasional palpitations.  He was evaluated in the ED for chest pain on July 3. It was deemed musculoskeletal in etiology. Troponins were unremarkable. Blood pressure was elevated, 151/90.  ECG which I personally interpreted demonstrated normal sinus rhythm with nonspecific T-wave abnormalities.  Family history: Mother died of MI at age 51. Father died of coronary artery disease at age 51. His sister is on the heart transplant list.    Review of Systems: As per "subjective", otherwise negative.  No Known Allergies  Current Outpatient Prescriptions  Medication Sig Dispense Refill  . amLODipine (NORVASC) 10 MG tablet Take 10 mg by mouth daily.    Marland Kitchen. ibuprofen (ADVIL,MOTRIN) 600 MG tablet Take 1 tablet (600 mg total) by mouth every 6 (six) hours as needed. 30 tablet 0  . losartan (COZAAR) 100 MG tablet Take 100 mg by mouth daily.    . methocarbamol (ROBAXIN) 500 MG tablet Take 2 tablets (1,000 mg total) by mouth every 8 (eight) hours as needed for muscle spasms. 30 tablet 0  . metoprolol tartrate (LOPRESSOR) 25 MG tablet Take 25-50 mg by mouth 2 (two) times daily. 50mg  in the morning, 25mg  at night     No current facility-administered medications for this visit.      Past Medical History:  Diagnosis Date  . Chronic back pain   . Hypertension   . PTSD (post-traumatic stress disorder)     Past Surgical History:  Procedure Laterality Date  . APPENDECTOMY    . KNEE SURGERY Bilateral    arthroscopic  . SHOULDER SURGERY      Social History   Social History  . Marital status: Divorced    Spouse name: N/A  . Number of children: N/A  . Years of education: N/A   Occupational History  .      applying for disability   Social History Main Topics  . Smoking status: Current Some Day Smoker    Packs/day: 0.50    Types: Cigarettes    Last attempt to quit: 12/17/2014  . Smokeless tobacco: Former NeurosurgeonUser    Quit date: 01/17/2016     Comment: Smokes on occasion   . Alcohol use No     Comment: occasional   . Drug use: No  . Sexual activity: Not Currently   Other Topics Concern  . Not on file   Social History Narrative  . No narrative on file     Vitals:   11/07/16 1442  BP: 130/90  Pulse: 87  SpO2: 98%  Weight: 196 lb (88.9 kg)  Height: 5\' 6"  (1.676 m)    Wt Readings from Last 3 Encounters:  11/07/16 196 lb (88.9 kg)  10/03/16 191 lb (86.6 kg)  05/19/16 193 lb 12.6 oz (87.9 kg)     PHYSICAL  EXAM General: NAD HEENT: Normal. Neck: No JVD, no thyromegaly. Lungs: Clear to auscultation bilaterally with normal respiratory effort. CV: Nondisplaced PMI.  Regular rate and rhythm, normal S1/S2, no S3/S4, no murmur. No pretibial or periankle edema.  No carotid bruit.   Abdomen: Soft, nontender, no distention.  Neurologic: Alert and oriented.  Psych: Normal affect. Skin: Normal. Musculoskeletal: No gross deformities.    ECG: Most recent ECG reviewed.   Labs: Lab Results  Component Value Date/Time   K 3.3 (L) 10/03/2016 07:20 AM   BUN 10 10/03/2016 07:20 AM   CREATININE 1.12 10/03/2016 07:20 AM   ALT 20 10/03/2016 07:20 AM   HGB 14.8 10/03/2016 07:20 AM     Lipids: No results found for: LDLCALC, LDLDIRECT, CHOL, TRIG,  HDL     ASSESSMENT AND PLAN:  1. Chest pain: He reportedly had a normal nuclear stress test earlier this year at the [place]. I will try and obtain those results. Chest pain is atypical for ischemic heart disease in that it lasts only seconds at a time. He does have occasional exertionally related chest pain. I will start Imdur 30 mg daily to see if he gets symptom relief. Given his strong family history, I will start aspirin 81 mg.  2. Hypertension: Mildly elevated diastolic blood pressure today. I am starting Imdur. Will continue to monitor blood pressure.     Disposition: Follow up 3 months with Dr. Wyline Mood  Time spent: 40 minutes, of which greater than 50% was spent reviewing symptoms, relevant blood tests and studies, and discussing management plan with the patient.    Prentice Docker, M.D., F.A.C.C.

## 2016-11-07 NOTE — Patient Instructions (Signed)
Medication Instructions:   Begin Imdur 30mg  daily.  Begin Aspirin 81mg  daily.  Continue all other medications.    Labwork: none  Testing/Procedures: none  Follow-Up: 3 months - Dr. Wyline MoodBranch   Any Other Special Instructions Will Be Listed Below (If Applicable).  If you need a refill on your cardiac medications before your next appointment, please call your pharmacy.

## 2016-11-14 ENCOUNTER — Encounter: Payer: Self-pay | Admitting: *Deleted

## 2017-02-13 ENCOUNTER — Ambulatory Visit: Payer: Self-pay | Admitting: Cardiology

## 2019-05-08 ENCOUNTER — Other Ambulatory Visit: Payer: Self-pay

## 2019-05-08 ENCOUNTER — Ambulatory Visit: Payer: Non-veteran care | Attending: Internal Medicine

## 2019-05-08 DIAGNOSIS — Z20822 Contact with and (suspected) exposure to covid-19: Secondary | ICD-10-CM

## 2019-05-09 ENCOUNTER — Telehealth: Payer: Self-pay

## 2019-05-09 ENCOUNTER — Telehealth: Payer: Self-pay | Admitting: *Deleted

## 2019-05-09 LAB — NOVEL CORONAVIRUS, NAA: SARS-CoV-2, NAA: NOT DETECTED

## 2019-05-09 NOTE — Telephone Encounter (Signed)
Pt. Checking on COVID 19 results, not available yet. °

## 2019-05-09 NOTE — Telephone Encounter (Signed)
Pt advised that his covid 19 test result is negative. He voiced understanding.

## 2021-01-05 ENCOUNTER — Other Ambulatory Visit: Payer: Self-pay | Admitting: Neurology

## 2021-01-05 ENCOUNTER — Other Ambulatory Visit (HOSPITAL_COMMUNITY): Payer: Self-pay | Admitting: Neurology

## 2021-01-05 DIAGNOSIS — I639 Cerebral infarction, unspecified: Secondary | ICD-10-CM

## 2021-01-13 ENCOUNTER — Ambulatory Visit (HOSPITAL_COMMUNITY)
Admission: RE | Admit: 2021-01-13 | Discharge: 2021-01-13 | Disposition: A | Discharge status: Home / Self Care | Payer: No Typology Code available for payment source | Source: Ambulatory Visit | Attending: Neurology | Admitting: Neurology

## 2021-01-13 ENCOUNTER — Other Ambulatory Visit: Payer: Self-pay

## 2021-01-13 ENCOUNTER — Ambulatory Visit (HOSPITAL_COMMUNITY): Payer: Non-veteran care

## 2021-01-13 DIAGNOSIS — I639 Cerebral infarction, unspecified: Secondary | ICD-10-CM | POA: Diagnosis not present

## 2021-01-13 MED ORDER — GADOBUTROL 1 MMOL/ML IV SOLN
10.0000 mL | Freq: Once | INTRAVENOUS | Status: AC | PRN
  Administered 2021-01-13: 10 mL via INTRAVENOUS

## 2021-04-01 ENCOUNTER — Encounter (HOSPITAL_COMMUNITY): Payer: Self-pay

## 2021-04-01 ENCOUNTER — Other Ambulatory Visit: Payer: Self-pay

## 2021-04-01 ENCOUNTER — Emergency Department (HOSPITAL_COMMUNITY)
Admission: EM | Admit: 2021-04-01 | Discharge: 2021-04-01 | Disposition: A | Discharge status: Home / Self Care | Payer: No Typology Code available for payment source | Attending: Emergency Medicine | Admitting: Emergency Medicine

## 2021-04-01 DIAGNOSIS — R112 Nausea with vomiting, unspecified: Secondary | ICD-10-CM | POA: Diagnosis not present

## 2021-04-01 DIAGNOSIS — I10 Essential (primary) hypertension: Secondary | ICD-10-CM | POA: Diagnosis not present

## 2021-04-01 DIAGNOSIS — F17210 Nicotine dependence, cigarettes, uncomplicated: Secondary | ICD-10-CM | POA: Diagnosis not present

## 2021-04-01 DIAGNOSIS — Z7982 Long term (current) use of aspirin: Secondary | ICD-10-CM | POA: Diagnosis not present

## 2021-04-01 DIAGNOSIS — Z79899 Other long term (current) drug therapy: Secondary | ICD-10-CM | POA: Diagnosis not present

## 2021-04-01 DIAGNOSIS — E876 Hypokalemia: Secondary | ICD-10-CM | POA: Insufficient documentation

## 2021-04-01 LAB — COMPREHENSIVE METABOLIC PANEL
ALT: 16 U/L (ref 0–44)
AST: 15 U/L (ref 15–41)
Albumin: 3.9 g/dL (ref 3.5–5.0)
Alkaline Phosphatase: 57 U/L (ref 38–126)
Anion gap: 12 (ref 5–15)
BUN: 12 mg/dL (ref 6–20)
CO2: 22 mmol/L (ref 22–32)
Calcium: 8.8 mg/dL — ABNORMAL LOW (ref 8.9–10.3)
Chloride: 106 mmol/L (ref 98–111)
Creatinine, Ser: 1.07 mg/dL (ref 0.61–1.24)
GFR, Estimated: >60 mL/min (ref >60)
Glucose, Bld: 212 mg/dL — ABNORMAL HIGH (ref 70–99)
Potassium: 2.9 mmol/L — ABNORMAL LOW (ref 3.5–5.1)
Sodium: 140 mmol/L (ref 135–145)
Total Bilirubin: 0.4 mg/dL (ref 0.3–1.2)
Total Protein: 7.1 g/dL (ref 6.5–8.1)

## 2021-04-01 LAB — URINALYSIS, ROUTINE W REFLEX MICROSCOPIC
Bacteria, UA: NONE SEEN
Bilirubin Urine: NEGATIVE
Glucose, UA: 50 mg/dL — AB
Hgb urine dipstick: NEGATIVE
Ketones, ur: 5 mg/dL — AB
Nitrite: NEGATIVE
Protein, ur: 30 mg/dL — AB
Specific Gravity, Urine: 1.026 (ref 1.005–1.030)
pH: 5 (ref 5.0–8.0)

## 2021-04-01 LAB — LIPASE, BLOOD: Lipase: 30 U/L (ref 11–51)

## 2021-04-01 LAB — CBC
HCT: 41.5 % (ref 39.0–52.0)
Hemoglobin: 14.2 g/dL (ref 13.0–17.0)
MCH: 32.9 pg (ref 26.0–34.0)
MCHC: 34.2 g/dL (ref 30.0–36.0)
MCV: 96.3 fL (ref 80.0–100.0)
Platelets: 344 10*3/uL (ref 150–400)
RBC: 4.31 MIL/uL (ref 4.22–5.81)
RDW: 14.3 % (ref 11.5–15.5)
WBC: 17.4 10*3/uL — ABNORMAL HIGH (ref 4.0–10.5)
nRBC: 0 % (ref 0.0–0.2)

## 2021-04-01 MED ORDER — ONDANSETRON HCL 4 MG/2ML IJ SOLN
4.0000 mg | Freq: Once | INTRAMUSCULAR | Status: AC
  Administered 2021-04-01: 20:00:00 4 mg via INTRAMUSCULAR
  Filled 2021-04-01: qty 2

## 2021-04-01 MED ORDER — ONDANSETRON 4 MG PO TBDP: 4.0000 mg | ORAL_TABLET | Freq: Three times a day (TID) | ORAL | 0 refills | Status: DC | PRN

## 2021-04-01 MED ORDER — POTASSIUM CHLORIDE CRYS ER 20 MEQ PO TBCR
40.0000 meq | EXTENDED_RELEASE_TABLET | Freq: Once | ORAL | Status: AC
  Administered 2021-04-01: 21:00:00 40 meq via ORAL
  Filled 2021-04-01: qty 2

## 2021-04-01 MED ORDER — ONDANSETRON 4 MG PO TBDP
4.0000 mg | ORAL_TABLET | Freq: Once | ORAL | Status: AC | PRN
  Administered 2021-04-01: 18:00:00 4 mg via ORAL
  Filled 2021-04-01: qty 1

## 2021-04-01 NOTE — ED Triage Notes (Signed)
Pt presents to ED, states he ate a gummy at the store, unsure of what it was but it was recommended by the owner, started feeling jittery, heart was racing, dizzy, nausea, vomiting x 4 and dry mouth.

## 2021-04-01 NOTE — Discharge Instructions (Addendum)
Take the nausea medicine as prescribed  Potassium level is mildly low we have given you medication for this here in the emergency department.  Follow-up with primary care provider to have this rechecked.  In the meantime I have attached a list of potassium rich foods.  Return for new or worsening symptoms.

## 2021-04-01 NOTE — ED Provider Notes (Signed)
[facility] EMERGENCY DEPARTMENT Provider Note   CSN: [id number] Arrival date & time: 04/01/21  1733     History Chief Complaint  Patient presents with   Nausea    Ryan [name] is a 55 y.o. male with past medical history significant for hypertension, tobacco use who presents for evaluation of nausea and vomiting.  States he was at a convenience store earlier today and ate a gummy which she was told was "really good" and had "good stuff in it."  He does not actually know what was in that gummy.  History of illicit substance use.  Patient states about an hour after he took it he had nausea, vomiting, felt "out of it" and lightheaded.  Unsure if THC was involved in this.  He denies any headache, lightness, dizziness, chest pain, shortness of breath, abdominal pain, diarrhea, dysuria.  Denies additional aggravating or relieving factors.  No EtOH use  History obtained from patient and past medical records.  No interpretor was used.  HPI     Past Medical History:  Diagnosis Date   Chronic back pain    Hypertension    PTSD (post-traumatic stress disorder)     Patient Active Problem List   Diagnosis Date Noted   Chest pain 05/19/2016   Essential hypertension 05/19/2016   Tobacco use disorder 05/19/2016    Past Surgical History:  Procedure Laterality Date   APPENDECTOMY     KNEE SURGERY Bilateral    arthroscopic   SHOULDER SURGERY         Family History  Problem Relation Age of Onset   Heart attack Mother 3       Deceased    Heart attack Father 29       Decesed    CAD Sister 38       Stents/ICD    Social History   Tobacco Use   Smoking status: Some Days    Packs/day: 0.50    Types: Cigarettes    Last attempt to quit: 12/17/2014    Years since quitting: 6.2   Smokeless tobacco: Former    Quit date: 01/17/2016   Tobacco comments:    Smokes on occasion   Vaping Use   Vaping Use: Never used  Substance Use Topics   Alcohol use: No    Comment: occasional     Drug use: No    Home Medications Prior to Admission medications   Medication Sig Start Date End Date Taking? Authorizing Provider  ondansetron (ZOFRAN-ODT) 4 MG disintegrating tablet Take 1 tablet (4 mg total) by mouth every 8 (eight) hours as needed for nausea or vomiting. 04/01/21  Yes Austen Wygant A, PA-C  amLODipine (NORVASC) 10 MG tablet Take 10 mg by mouth daily.    [provider]  aspirin EC 81 MG tablet Take 1 tablet (81 mg total) by mouth daily. 11/07/16   Laqueta Linden, MD  ibuprofen (ADVIL,MOTRIN) 600 MG tablet Take 1 tablet (600 mg total) by mouth every 6 (six) hours as needed. 10/03/16   Loren Racer, MD  isosorbide mononitrate (IMDUR) 30 MG 24 hr tablet Take 1 tablet (30 mg total) by mouth daily. 11/07/16 02/05/17  Laqueta Linden, MD  losartan (COZAAR) 100 MG tablet Take 100 mg by mouth daily.    [provider]  methocarbamol (ROBAXIN) 500 MG tablet Take 2 tablets (1,000 mg total) by mouth every 8 (eight) hours as needed for muscle spasms. 10/03/16   Loren Racer, MD  metoprolol tartrate (LOPRESSOR) 25  MG tablet Take 25-50 mg by mouth 2 (two) times daily. 50mg  in the morning, 25mg  at night    [provider]    Allergies    Patient has no known allergies.  Review of Systems   Review of Systems  Constitutional: Negative.   HENT: Negative.    Respiratory: Negative.    Cardiovascular: Negative.   Gastrointestinal:  Positive for nausea and vomiting. Negative for abdominal distention, abdominal pain, anal bleeding, blood in stool, constipation, diarrhea and rectal pain.  Genitourinary: Negative.   Musculoskeletal: Negative.   Skin: Negative.   Neurological: Negative.   All other systems reviewed and are negative.  Physical Exam Updated Vital Signs BP (!) 157/86 (BP Location: Right Arm)    Pulse 93    Temp 97.7 F (36.5 C) (Oral)    Resp 18    Ht 5\' 6"  (1.676 m)    Wt 93.9 kg    SpO2 95%    BMI 33.41 kg/m   Physical  Exam Vitals and nursing note reviewed.  Constitutional:      General: He is not in acute distress.    Appearance: He is well-developed. He is not ill-appearing, toxic-appearing or diaphoretic.  HENT:     Head: Normocephalic and atraumatic.     Nose: Nose normal.     Mouth/Throat:     Mouth: Mucous membranes are moist.  Eyes:     Pupils: Pupils are equal, round, and reactive to light.  Cardiovascular:     Rate and Rhythm: Normal rate and regular rhythm.     Pulses: Normal pulses.     Heart sounds: Normal heart sounds.  Pulmonary:     Effort: Pulmonary effort is normal. No respiratory distress.     Breath sounds: Normal breath sounds.  Abdominal:     General: Bowel sounds are normal. There is no distension.     Palpations: Abdomen is soft. There is no mass.     Tenderness: There is no abdominal tenderness. There is no right CVA tenderness, left CVA tenderness, guarding or rebound.     Hernia: No hernia is present.  Musculoskeletal:        General: No swelling, tenderness, deformity or signs of injury. Normal range of motion.     Cervical back: Normal range of motion and neck supple.     Right lower leg: No edema.     Left lower leg: No edema.  Skin:    General: Skin is warm and dry.     Capillary Refill: Capillary refill takes less than 2 seconds.  Neurological:     General: No focal deficit present.     Mental Status: He is alert and oriented to person, place, and time.    ED Results / Procedures / Treatments   Labs (all labs ordered are listed, but only abnormal results are displayed) Labs Reviewed  COMPREHENSIVE METABOLIC PANEL - Abnormal; Notable for the following components:      Result Value   Potassium 2.9 (*)    Glucose, Bld 212 (*)    Calcium 8.8 (*)    All other components within normal limits  CBC - Abnormal; Notable for the following components:   WBC 17.4 (*)    All other components within normal limits  LIPASE, BLOOD  URINALYSIS, ROUTINE W REFLEX  MICROSCOPIC    EKG None  Radiology No results found.  Procedures Procedures   Medications Ordered in ED Medications  ondansetron (ZOFRAN-ODT) disintegrating tablet 4 mg (4 mg Oral  Given 04/01/21 1812)  ondansetron (ZOFRAN) injection 4 mg (4 mg Intramuscular Given 04/01/21 2014)  potassium chloride SA (KLOR-CON M) CR tablet 40 mEq (40 mEq Oral Given 04/01/21 2112)    ED Course  I have reviewed the triage vital signs and the nursing notes.  Pertinent labs & imaging results that were available during my care of the patient were reviewed by me and considered in my medical decision making (see chart for details).  55 year old here for evaluation of nausea and vomiting after eating edible at a convenience store with unknown substance within it.  No history of illicit substance use, EtOH use.  No associate abdominal pain.  Lungs clear but abdomen soft, nontender.  No chest pain, shortness of breath.  Work-up started from triage which I personally reviewed and interpreted: CBC with some leukocytosis Metabolic panel mild hypokalemia at 2.9, will supplement Lipase 30 EKG without ischemic changes  Patient reassessed.  Feels improved, tolerating p.o. intake.  Requesting DC home.  Repeat abdominal exam benign.  No further emesis here.    Patient does not meet the SIRS or Sepsis criteria.  On repeat exam patient does not have a surgical abdomin and there are no peritoneal signs.  No indication of appendicitis, bowel obstruction, bowel perforation, cholecystitis, diverticulitis, AAA, dissection, ACS, PE, intracranial abnormality cause emesis.   The patient has been appropriately medically screened and/or stabilized in the ED. I have low suspicion for any other emergent medical condition which would require further screening, evaluation or treatment in the ED or require inpatient management.  Patient is hemodynamically stable and in no acute distress.  Patient able to ambulate in department  prior to ED.  Evaluation does not show acute pathology that would require ongoing or additional emergent interventions while in the emergency department or further inpatient treatment.  I have discussed the diagnosis with the patient and answered all questions.  Pain is been managed while in the emergency department and patient has no further complaints prior to discharge.  Patient is comfortable with plan discussed in room and is stable for discharge at this time.  I have discussed strict return precautions for returning to the emergency department.  Patient was encouraged to follow-up with PCP/specialist refer to at discharge.     MDM Rules/Calculators/A&P                             Final Clinical Impression(s) / ED Diagnoses Final diagnoses:  Nausea and vomiting, unspecified vomiting type  Hypokalemia    Rx / DC Orders ED Discharge Orders          Ordered    ondansetron (ZOFRAN-ODT) 4 MG disintegrating tablet  Every 8 hours PRN        04/01/21 2159             Idamae Coccia A, PA-C 04/01/21 2210    Eber Hong, MD 04/02/21 1101

## 2021-04-28 ENCOUNTER — Encounter: Payer: Self-pay | Admitting: Internal Medicine

## 2021-05-04 ENCOUNTER — Encounter: Payer: Self-pay | Admitting: Internal Medicine

## 2021-06-08 ENCOUNTER — Ambulatory Visit: Payer: No Typology Code available for payment source | Admitting: Internal Medicine

## 2021-06-15 ENCOUNTER — Ambulatory Visit (INDEPENDENT_AMBULATORY_CARE_PROVIDER_SITE_OTHER): Payer: No Typology Code available for payment source | Admitting: Internal Medicine

## 2021-06-15 ENCOUNTER — Other Ambulatory Visit: Payer: Self-pay

## 2021-06-15 ENCOUNTER — Encounter: Payer: Self-pay | Admitting: Internal Medicine

## 2021-06-15 VITALS — BP 144/72 | HR 86 | Temp 97.7°F | Ht 66.0 in | Wt 209.8 lb

## 2021-06-15 DIAGNOSIS — K219 Gastro-esophageal reflux disease without esophagitis: Secondary | ICD-10-CM

## 2021-06-15 DIAGNOSIS — Z1211 Encounter for screening for malignant neoplasm of colon: Secondary | ICD-10-CM | POA: Diagnosis not present

## 2021-06-15 NOTE — Patient Instructions (Signed)
We will schedule you for upper endoscopy to evaluate your chronic reflux as well as screen you for Barrett's esophagus.  Continue on omeprazole. ? ?At the same time we will perform colonoscopy for colon cancer screening purposes. ? ?It was very nice meeting you today.  Thank you for your service to our country.  Good luck in the [employer] versus [employer]  [employer] ? ?Dr. Marletta Lor ? ?At [facility] we value your feedback. You may receive a survey about your visit today. Please share your experience as we strive to create trusting relationships with our patients to provide genuine, compassionate, quality care. ? ?We appreciate your understanding and patience as we review any laboratory studies, imaging, and other diagnostic tests that are ordered as we care for you. Our office policy is 5 business days for review of these results, and any emergent or urgent results are addressed in a timely manner for your best interest. If you do not hear from our office in 1 week, please contact us.  ? ?We also encourage the use of MyChart, which contains your medical information for your review as well. If you are not enrolled in this feature, an access code is on this after visit summary for your convenience. Thank you for allowing [place] to be involved in your care. ? ?It was great to see you today!  I hope you have a great rest of your Winter! ? ? ? ?Hennie Duos. Marletta Lor, D.O. ?Gastroenterology and Hepatology ?[facility] ? ?

## 2021-06-15 NOTE — H&P (View-Only) (Signed)
? ? ?Primary Care Physician:  [facility] ?Primary Gastroenterologist:  Dr. Marletta Lor ? ?Chief Complaint  ?Patient presents with  ? triage  ?  Colonoscopy and endoscopy  ? ? ?HPI:   ?Ryan [name] is a 56 y.o. male who presents to clinic today by referral from the [place] to discuss EGD and colonoscopy.  Patient has never had a colonoscopy before.  No family history of colorectal malignancy.  No melena hematochezia.  No abdominal pain.  No unintentional weight loss. ? ?Does have chronic reflux.  States this was out of control until he was placed on omeprazole.  Now much better controlled.  History of dysphagia that is also resolved since starting omeprazole.  No epigastric or chest pain.  No chronic NSAID use.  No history of H. pylori that he is aware of. ? ?Past Medical History:  ?Diagnosis Date  ? Chronic back pain   ? Hypertension   ? PTSD (post-traumatic stress disorder)   ? ? ?Past Surgical History:  ?Procedure Laterality Date  ? APPENDECTOMY    ? KNEE SURGERY Bilateral   ? arthroscopic  ? SHOULDER SURGERY    ? ? ?Current Outpatient Medications  ?Medication Sig Dispense Refill  ? amLODipine (NORVASC) 10 MG tablet Take 10 mg by mouth daily.    ? losartan (COZAAR) 100 MG tablet Take 100 mg by mouth daily.    ? metoprolol tartrate (LOPRESSOR) 25 MG tablet Take 25-50 mg by mouth 2 (two) times daily. 50mg  in the morning, 25mg  at night    ? omeprazole (PRILOSEC) 40 MG capsule Take 40 mg by mouth daily.    ? aspirin EC 81 MG tablet Take 1 tablet (81 mg total) by mouth daily. (Patient not taking: Reported on 06/15/2021)    ? ibuprofen (ADVIL,MOTRIN) 600 MG tablet Take 1 tablet (600 mg total) by mouth every 6 (six) hours as needed. (Patient not taking: Reported on 06/15/2021) 30 tablet 0  ? isosorbide mononitrate (IMDUR) 30 MG 24 hr tablet Take 1 tablet (30 mg total) by mouth daily. 30 tablet 6  ? methocarbamol (ROBAXIN) 500 MG tablet Take 2 tablets (1,000 mg total) by mouth every 8 (eight) hours as needed for muscle  spasms. (Patient not taking: Reported on 06/15/2021) 30 tablet 0  ? ondansetron (ZOFRAN-ODT) 4 MG disintegrating tablet Take 1 tablet (4 mg total) by mouth every 8 (eight) hours as needed for nausea or vomiting. (Patient not taking: Reported on 06/15/2021) 20 tablet 0  ? ?No current facility-administered medications for this visit.  ? ? ?Allergies as of 06/15/2021  ? (No Known Allergies)  ? ? ?Family History  ?Problem Relation Age of Onset  ? Heart attack Mother 68  ?     Deceased   ? Heart attack Father 36  ?     Decesed   ? CAD Sister 88  ?     Stents/ICD  ? ? ?Social History  ? ?Socioeconomic History  ? Marital status: Divorced  ?  Spouse name: Not on file  ? Number of children: Not on file  ? Years of education: Not on file  ? Highest education level: Not on file  ?Occupational History  ?  Comment: applying for disability  ?Tobacco Use  ? Smoking status: Some Days  ?  Packs/day: 0.50  ?  Types: Cigarettes  ?  Last attempt to quit: 12/17/2014  ?  Years since quitting: 6.4  ? Smokeless tobacco: Former  ?  Quit date: 01/17/2016  ? Tobacco  comments:  ?  Smokes on occasion   ?Vaping Use  ? Vaping Use: Never used  ?Substance and Sexual Activity  ? Alcohol use: No  ?  Comment: occasional   ? Drug use: No  ? Sexual activity: Not Currently  ?Other Topics Concern  ? Not on file  ?Social History Narrative  ? Not on file  ? ?Social Determinants of Health  ? ?Financial Resource Strain: Not on file  ?Food Insecurity: Not on file  ?Transportation Needs: Not on file  ?Physical Activity: Not on file  ?Stress: Not on file  ?Social Connections: Not on file  ?Intimate Partner Violence: Not on file  ? ? ?Subjective: ?Review of Systems  ?Constitutional:  Negative for chills and fever.  ?HENT:  Negative for congestion and hearing loss.   ?Eyes:  Negative for blurred vision and double vision.  ?Respiratory:  Negative for cough and shortness of breath.   ?Cardiovascular:  Negative for chest pain and palpitations.  ?Gastrointestinal:   Positive for heartburn. Negative for abdominal pain, blood in stool, constipation, diarrhea, melena and vomiting.  ?Genitourinary:  Negative for dysuria and urgency.  ?Musculoskeletal:  Negative for joint pain and myalgias.  ?Skin:  Negative for itching and rash.  ?Neurological:  Negative for dizziness and headaches.  ?Psychiatric/Behavioral:  Negative for depression. The patient is not nervous/anxious.    ? ? ? ?Objective: ?BP (!) 144/72   Pulse 86   Temp 97.7 ?F (36.5 ?C)   Ht 5\' 6"  (1.676 m)   Wt 209 lb 12.8 oz (95.2 kg)   BMI 33.86 kg/m?  ?Physical Exam ?Constitutional:   ?   Appearance: Normal appearance.  ?HENT:  ?   Head: Normocephalic and atraumatic.  ?Eyes:  ?   Extraocular Movements: Extraocular movements intact.  ?   Conjunctiva/sclera: Conjunctivae normal.  ?Cardiovascular:  ?   Rate and Rhythm: Normal rate and regular rhythm.  ?Pulmonary:  ?   Effort: Pulmonary effort is normal.  ?   Breath sounds: Normal breath sounds.  ?Abdominal:  ?   General: Bowel sounds are normal.  ?   Palpations: Abdomen is soft.  ?Musculoskeletal:     ?   General: Normal range of motion.  ?   Cervical back: Normal range of motion and neck supple.  ?Skin: ?   General: Skin is warm.  ?Neurological:  ?   General: No focal deficit present.  ?   Mental Status: He is alert and oriented to person, place, and time.  ?Psychiatric:     ?   Mood and Affect: Mood normal.     ?   Behavior: Behavior normal.  ? ? ? ?Assessment: ?*Chronic GERD-well-controlled on omeprazole ?*Colon cancer screening ? ?Plan: ?Patient's GERD well-controlled on omeprazole.  We will continue.  Will perform EGD for screening for Barrett's esophagus. ? ?At the same time we will we will perform colonoscopy for colon cancer screening purposes. The risks including infection, bleed, or perforation as well as benefits, limitations, alternatives and imponderables have been reviewed with the patient. Questions have been answered. All parties agreeable. ? ?Further  recommendations to follow. ? ? ?06/15/2021 9:03 AM ? ? ?Disclaimer: This note was dictated with voice recognition software. Similar sounding words can inadvertently be transcribed and may not be corrected upon review. ? ?

## 2021-06-15 NOTE — Progress Notes (Signed)
? ? ?Primary Care Physician:  [facility] ?Primary Gastroenterologist:  Dr. Marletta Lor ? ?Chief Complaint  ?Patient presents with  ? triage  ?  Colonoscopy and endoscopy  ? ? ?HPI:   ?Ryan [name] is a 56 y.o. male who presents to clinic today by referral from the [place] to discuss EGD and colonoscopy.  Patient has never had a colonoscopy before.  No family history of colorectal malignancy.  No melena hematochezia.  No abdominal pain.  No unintentional weight loss. ? ?Does have chronic reflux.  States this was out of control until he was placed on omeprazole.  Now much better controlled.  History of dysphagia that is also resolved since starting omeprazole.  No epigastric or chest pain.  No chronic NSAID use.  No history of H. pylori that he is aware of. ? ?Past Medical History:  ?Diagnosis Date  ? Chronic back pain   ? Hypertension   ? PTSD (post-traumatic stress disorder)   ? ? ?Past Surgical History:  ?Procedure Laterality Date  ? APPENDECTOMY    ? KNEE SURGERY Bilateral   ? arthroscopic  ? SHOULDER SURGERY    ? ? ?Current Outpatient Medications  ?Medication Sig Dispense Refill  ? amLODipine (NORVASC) 10 MG tablet Take 10 mg by mouth daily.    ? losartan (COZAAR) 100 MG tablet Take 100 mg by mouth daily.    ? metoprolol tartrate (LOPRESSOR) 25 MG tablet Take 25-50 mg by mouth 2 (two) times daily. 50mg  in the morning, 25mg  at night    ? omeprazole (PRILOSEC) 40 MG capsule Take 40 mg by mouth daily.    ? aspirin EC 81 MG tablet Take 1 tablet (81 mg total) by mouth daily. (Patient not taking: Reported on 06/15/2021)    ? ibuprofen (ADVIL,MOTRIN) 600 MG tablet Take 1 tablet (600 mg total) by mouth every 6 (six) hours as needed. (Patient not taking: Reported on 06/15/2021) 30 tablet 0  ? isosorbide mononitrate (IMDUR) 30 MG 24 hr tablet Take 1 tablet (30 mg total) by mouth daily. 30 tablet 6  ? methocarbamol (ROBAXIN) 500 MG tablet Take 2 tablets (1,000 mg total) by mouth every 8 (eight) hours as needed for muscle  spasms. (Patient not taking: Reported on 06/15/2021) 30 tablet 0  ? ondansetron (ZOFRAN-ODT) 4 MG disintegrating tablet Take 1 tablet (4 mg total) by mouth every 8 (eight) hours as needed for nausea or vomiting. (Patient not taking: Reported on 06/15/2021) 20 tablet 0  ? ?No current facility-administered medications for this visit.  ? ? ?Allergies as of 06/15/2021  ? (No Known Allergies)  ? ? ?Family History  ?Problem Relation Age of Onset  ? Heart attack Mother 68  ?     Deceased   ? Heart attack Father 36  ?     Decesed   ? CAD Sister 88  ?     Stents/ICD  ? ? ?Social History  ? ?Socioeconomic History  ? Marital status: Divorced  ?  Spouse name: Not on file  ? Number of children: Not on file  ? Years of education: Not on file  ? Highest education level: Not on file  ?Occupational History  ?  Comment: applying for disability  ?Tobacco Use  ? Smoking status: Some Days  ?  Packs/day: 0.50  ?  Types: Cigarettes  ?  Last attempt to quit: 12/17/2014  ?  Years since quitting: 6.4  ? Smokeless tobacco: Former  ?  Quit date: 01/17/2016  ? Tobacco  comments:  ?  Smokes on occasion   ?Vaping Use  ? Vaping Use: Never used  ?Substance and Sexual Activity  ? Alcohol use: No  ?  Comment: occasional   ? Drug use: No  ? Sexual activity: Not Currently  ?Other Topics Concern  ? Not on file  ?Social History Narrative  ? Not on file  ? ?Social Determinants of Health  ? ?Financial Resource Strain: Not on file  ?Food Insecurity: Not on file  ?Transportation Needs: Not on file  ?Physical Activity: Not on file  ?Stress: Not on file  ?Social Connections: Not on file  ?Intimate Partner Violence: Not on file  ? ? ?Subjective: ?Review of Systems  ?Constitutional:  Negative for chills and fever.  ?HENT:  Negative for congestion and hearing loss.   ?Eyes:  Negative for blurred vision and double vision.  ?Respiratory:  Negative for cough and shortness of breath.   ?Cardiovascular:  Negative for chest pain and palpitations.  ?Gastrointestinal:   Positive for heartburn. Negative for abdominal pain, blood in stool, constipation, diarrhea, melena and vomiting.  ?Genitourinary:  Negative for dysuria and urgency.  ?Musculoskeletal:  Negative for joint pain and myalgias.  ?Skin:  Negative for itching and rash.  ?Neurological:  Negative for dizziness and headaches.  ?Psychiatric/Behavioral:  Negative for depression. The patient is not nervous/anxious.    ? ? ? ?Objective: ?BP (!) 144/72   Pulse 86   Temp 97.7 ?F (36.5 ?C)   Ht 5' 6" (1.676 m)   Wt 209 lb 12.8 oz (95.2 kg)   BMI 33.86 kg/m?  ?Physical Exam ?Constitutional:   ?   Appearance: Normal appearance.  ?HENT:  ?   Head: Normocephalic and atraumatic.  ?Eyes:  ?   Extraocular Movements: Extraocular movements intact.  ?   Conjunctiva/sclera: Conjunctivae normal.  ?Cardiovascular:  ?   Rate and Rhythm: Normal rate and regular rhythm.  ?Pulmonary:  ?   Effort: Pulmonary effort is normal.  ?   Breath sounds: Normal breath sounds.  ?Abdominal:  ?   General: Bowel sounds are normal.  ?   Palpations: Abdomen is soft.  ?Musculoskeletal:     ?   General: Normal range of motion.  ?   Cervical back: Normal range of motion and neck supple.  ?Skin: ?   General: Skin is warm.  ?Neurological:  ?   General: No focal deficit present.  ?   Mental Status: He is alert and oriented to person, place, and time.  ?Psychiatric:     ?   Mood and Affect: Mood normal.     ?   Behavior: Behavior normal.  ? ? ? ?Assessment: ?*Chronic GERD-well-controlled on omeprazole ?*Colon cancer screening ? ?Plan: ?Patient's GERD well-controlled on omeprazole.  We will continue.  Will perform EGD for screening for Barrett's esophagus. ? ?At the same time we will we will perform colonoscopy for colon cancer screening purposes. The risks including infection, bleed, or perforation as well as benefits, limitations, alternatives and imponderables have been reviewed with the patient. Questions have been answered. All parties agreeable. ? ?Further  recommendations to follow. ? ? ?06/15/2021 9:03 AM ? ? ?Disclaimer: This note was dictated with voice recognition software. Similar sounding words can inadvertently be transcribed and may not be corrected upon review. ? ?

## 2021-06-16 ENCOUNTER — Telehealth: Payer: Self-pay | Admitting: *Deleted

## 2021-06-16 ENCOUNTER — Telehealth: Payer: Self-pay | Admitting: Internal Medicine

## 2021-06-16 NOTE — Telephone Encounter (Signed)
LMOVM to call back to schedule TCS/EGD asa 2, Dr. Marletta Lor ?

## 2021-06-16 NOTE — Telephone Encounter (Signed)
Patient returned call, please call back  

## 2021-06-17 MED ORDER — PEG 3350-KCL-NA BICARB-NACL 420 G PO SOLR: ORAL | 0 refills | Status: DC

## 2021-06-17 NOTE — Telephone Encounter (Signed)
LMOVM to call back 

## 2021-06-17 NOTE — Telephone Encounter (Signed)
Patient returned call. He has been scheduled for 3/31 at 1:45pm. Aware will mail instructions and send prep Rx to pharmacy. ?

## 2021-06-17 NOTE — Telephone Encounter (Signed)
See prior note

## 2021-06-24 NOTE — Patient Instructions (Signed)
? ? ? ? ? ? ? ? Ryan [name] ? 06/24/2021  ?  ? @PREFPERIOPPHARMACY @ ? ? Your procedure is scheduled on  07/01/2021. ? ? Report to 07/03/2021 at  1115  A.M. ? ? Call this number if you have problems the morning of surgery: ? [phone] ? ? Remember: ? Follow the diet and prep instructions given to you by the office. ?  ? Take these medicines the morning of surgery with A SIP OF WATER  ? ?amlodipine, metoprolol, prilosec. ? ?  ? Do not wear jewelry, make-up or nail polish. ? Do not wear lotions, powders, or perfumes, or deodorant. ? Do not shave 48 hours prior to surgery.  Men may shave face and neck. ? Do not bring valuables to the hospital. ? [facility] is not responsible for any belongings or valuables. ? ?Contacts, dentures or bridgework may not be worn into surgery.  Leave your suitcase in the car.  After surgery it may be brought to your room. ? ?For patients admitted to the hospital, discharge time will be determined by your treatment team. ? ?Patients discharged the day of surgery will not be allowed to drive home and must have someone with them for 24 hours.  ? ? ?Special instructions:   DO NOT smoke tobacco or vape for 24 hours before your procedure. ? ?Please read over the following fact sheets that you were given. ?Anesthesia Post-op Instructions and Care and Recovery After Surgery ?  ? ? ? Upper Endoscopy, Adult, Care After ?This sheet gives you information about how to care for yourself after your procedure. Your health care provider may also give you more specific instructions. If you have problems or questions, contact your health care provider. ?What can I expect after the procedure? ?After the procedure, it is common to have: ?A sore throat. ?Mild stomach pain or discomfort. ?Bloating. ?Nausea. ?Follow these instructions at home: ? ?Follow instructions from your health care provider about what to eat or drink after your procedure. ?Return to your normal activities as told by your health care  provider. Ask your health care provider what activities are safe for you. ?Take over-the-counter and prescription medicines only as told by your health care provider. ?If you were given a sedative during the procedure, it can affect you for several hours. Do not drive or operate machinery until your health care provider says that it is safe. ?Keep all follow-up visits as told by your health care provider. This is important. ?Contact a health care provider if you have: ?A sore throat that lasts longer than one day. ?Trouble swallowing. ?Get help right away if: ?You vomit blood or your vomit looks like coffee grounds. ?You have: ?A fever. ?Bloody, black, or tarry stools. ?A severe sore throat or you cannot swallow. ?Difficulty breathing. ?Severe pain in your chest or abdomen. ?Summary ?After the procedure, it is common to have a sore throat, mild stomach discomfort, bloating, and nausea. ?If you were given a sedative during the procedure, it can affect you for several hours. Do not drive or operate machinery until your health care provider says that it is safe. ?Follow instructions from your health care provider about what to eat or drink after your procedure. ?Return to your normal activities as told by your health care provider. ?This information is not intended to replace advice given to you by your health care provider. Make sure you discuss any questions you have with your health care provider. ?Document Revised: 01/24/2019 Document  Reviewed: 08/20/2017 ?Elsevier Patient Education ? [place]. ?Colonoscopy, Adult, Care After ?This sheet gives you information about how to care for yourself after your procedure. Your health care provider may also give you more specific instructions. If you have problems or questions, contact your health care provider. ?What can I expect after the procedure? ?After the procedure, it is common to have: ?A small amount of blood in your stool for 24 hours after the  procedure. ?Some gas. ?Mild cramping or bloating of your abdomen. ?Follow these instructions at home: ?Eating and drinking ? ?Drink enough fluid to keep your urine pale yellow. ?Follow instructions from your health care provider about eating or drinking restrictions. ?Resume your normal diet as instructed by your health care provider. Avoid heavy or fried foods that are hard to digest. ?Activity ?Rest as told by your health care provider. ?Avoid sitting for a long time without moving. Get up to take short walks every 1-2 hours. This is important to improve blood flow and breathing. Ask for help if you feel weak or unsteady. ?Return to your normal activities as told by your health care provider. Ask your health care provider what activities are safe for you. ?Managing cramping and bloating ? ?Try walking around when you have cramps or feel bloated. ?Apply heat to your abdomen as told by your health care provider. Use the heat source that your health care provider recommends, such as a moist heat pack or a heating pad. ?Place a towel between your skin and the heat source. ?Leave the heat on for 20-30 minutes. ?Remove the heat if your skin turns bright red. This is especially important if you are unable to feel pain, heat, or cold. You may have a greater risk of getting burned. ?General instructions ?If you were given a sedative during the procedure, it can affect you for several hours. Do not drive or operate machinery until your health care provider says that it is safe. ?For the first 24 hours after the procedure: ?Do not sign important documents. ?Do not drink alcohol. ?Do your regular daily activities at a slower pace than normal. ?Eat soft foods that are easy to digest. ?Take over-the-counter and prescription medicines only as told by your health care provider. ?Keep all follow-up visits as told by your health care provider. This is important. ?Contact a health care provider if: ?You have blood in your stool 2-3  days after the procedure. ?Get help right away if you have: ?More than a small spotting of blood in your stool. ?Large blood clots in your stool. ?Swelling of your abdomen. ?Nausea or vomiting. ?A fever. ?Increasing pain in your abdomen that is not relieved with medicine. ?Summary ?After the procedure, it is common to have a small amount of blood in your stool. You may also have mild cramping and bloating of your abdomen. ?If you were given a sedative during the procedure, it can affect you for several hours. Do not drive or operate machinery until your health care provider says that it is safe. ?Get help right away if you have a lot of blood in your stool, nausea or vomiting, a fever, or increased pain in your abdomen. ?This information is not intended to replace advice given to you by your health care provider. Make sure you discuss any questions you have with your health care provider. ?Document Revised: 01/24/2019 Document Reviewed: 10/14/2018 ?Elsevier Patient Education ? [place]. ?Monitored Anesthesia Care, Care After ?This sheet gives you information about how to  care for yourself after your procedure. Your health care provider may also give you more specific instructions. If you have problems or questions, contact your health care provider. ?What can I expect after the procedure? ?After the procedure, it is common to have: ?Tiredness. ?Forgetfulness about what happened after the procedure. ?Impaired judgment for important decisions. ?Nausea or vomiting. ?Some difficulty with balance. ?Follow these instructions at home: ?For the time period you were told by your health care provider: ?  ?Rest as needed. ?Do not participate in activities where you could fall or become injured. ?Do not drive or use machinery. ?Do not drink alcohol. ?Do not take sleeping pills or medicines that cause drowsiness. ?Do not make important decisions or sign legal documents. ?Do not take care of children on your own. ?Eating  and drinking ?Follow the diet that is recommended by your health care provider. ?Drink enough fluid to keep your urine pale yellow. ?If you vomit: ?Drink water, juice, or soup when you can drink without vomiting.

## 2021-06-27 ENCOUNTER — Encounter (HOSPITAL_COMMUNITY): Payer: Self-pay

## 2021-06-27 ENCOUNTER — Encounter (HOSPITAL_COMMUNITY)
Admission: RE | Admit: 2021-06-27 | Discharge: 2021-06-27 | Disposition: A | Discharge status: Home / Self Care | Payer: No Typology Code available for payment source | Source: Ambulatory Visit | Attending: Internal Medicine | Admitting: Internal Medicine

## 2021-06-27 VITALS — BP 140/85 | HR 74 | Temp 97.7°F | Resp 18 | Ht 66.0 in | Wt 209.8 lb

## 2021-06-27 DIAGNOSIS — E876 Hypokalemia: Secondary | ICD-10-CM | POA: Diagnosis not present

## 2021-06-27 DIAGNOSIS — Z01812 Encounter for preprocedural laboratory examination: Secondary | ICD-10-CM | POA: Insufficient documentation

## 2021-06-27 LAB — BASIC METABOLIC PANEL
Anion gap: 7 (ref 5–15)
BUN: 14 mg/dL (ref 6–20)
CO2: 23 mmol/L (ref 22–32)
Calcium: 8.8 mg/dL — ABNORMAL LOW (ref 8.9–10.3)
Chloride: 107 mmol/L (ref 98–111)
Creatinine, Ser: 1.06 mg/dL (ref 0.61–1.24)
GFR, Estimated: >60 mL/min (ref >60)
Glucose, Bld: 141 mg/dL — ABNORMAL HIGH (ref 70–99)
Potassium: 3.6 mmol/L (ref 3.5–5.1)
Sodium: 137 mmol/L (ref 135–145)

## 2021-07-01 ENCOUNTER — Ambulatory Visit (HOSPITAL_COMMUNITY)
Admission: RE | Admit: 2021-07-01 | Discharge: 2021-07-01 | Disposition: A | Discharge status: Home / Self Care | Payer: No Typology Code available for payment source | Attending: Internal Medicine | Admitting: Internal Medicine

## 2021-07-01 ENCOUNTER — Other Ambulatory Visit: Payer: Self-pay

## 2021-07-01 ENCOUNTER — Encounter (HOSPITAL_COMMUNITY): Payer: Self-pay

## 2021-07-01 ENCOUNTER — Ambulatory Visit (HOSPITAL_BASED_OUTPATIENT_CLINIC_OR_DEPARTMENT_OTHER): Payer: No Typology Code available for payment source | Admitting: Anesthesiology

## 2021-07-01 ENCOUNTER — Encounter (HOSPITAL_COMMUNITY)
Admission: RE | Disposition: A | Discharge status: Home / Self Care | Payer: Self-pay | Source: Home / Self Care | Attending: Internal Medicine

## 2021-07-01 ENCOUNTER — Ambulatory Visit (HOSPITAL_COMMUNITY): Payer: No Typology Code available for payment source | Admitting: Anesthesiology

## 2021-07-01 DIAGNOSIS — K3189 Other diseases of stomach and duodenum: Secondary | ICD-10-CM

## 2021-07-01 DIAGNOSIS — Z13810 Encounter for screening for upper gastrointestinal disorder: Secondary | ICD-10-CM | POA: Diagnosis not present

## 2021-07-01 DIAGNOSIS — I10 Essential (primary) hypertension: Secondary | ICD-10-CM | POA: Diagnosis not present

## 2021-07-01 DIAGNOSIS — K449 Diaphragmatic hernia without obstruction or gangrene: Secondary | ICD-10-CM | POA: Diagnosis not present

## 2021-07-01 DIAGNOSIS — K635 Polyp of colon: Secondary | ICD-10-CM

## 2021-07-01 DIAGNOSIS — K222 Esophageal obstruction: Secondary | ICD-10-CM | POA: Diagnosis not present

## 2021-07-01 DIAGNOSIS — K648 Other hemorrhoids: Secondary | ICD-10-CM | POA: Diagnosis not present

## 2021-07-01 DIAGNOSIS — K219 Gastro-esophageal reflux disease without esophagitis: Secondary | ICD-10-CM | POA: Diagnosis not present

## 2021-07-01 DIAGNOSIS — K31A19 Gastric intestinal metaplasia without dysplasia, unspecified site: Secondary | ICD-10-CM | POA: Insufficient documentation

## 2021-07-01 DIAGNOSIS — Z1211 Encounter for screening for malignant neoplasm of colon: Secondary | ICD-10-CM | POA: Diagnosis not present

## 2021-07-01 DIAGNOSIS — K2980 Duodenitis without bleeding: Secondary | ICD-10-CM | POA: Insufficient documentation

## 2021-07-01 DIAGNOSIS — F17210 Nicotine dependence, cigarettes, uncomplicated: Secondary | ICD-10-CM | POA: Insufficient documentation

## 2021-07-01 DIAGNOSIS — F419 Anxiety disorder, unspecified: Secondary | ICD-10-CM | POA: Diagnosis not present

## 2021-07-01 HISTORY — PX: BIOPSY: SHX5522

## 2021-07-01 HISTORY — PX: COLONOSCOPY WITH PROPOFOL: SHX5780

## 2021-07-01 HISTORY — PX: ESOPHAGOGASTRODUODENOSCOPY (EGD) WITH PROPOFOL: SHX5813

## 2021-07-01 SURGERY — COLONOSCOPY WITH PROPOFOL
Anesthesia: General

## 2021-07-01 MED ORDER — LIDOCAINE HCL (CARDIAC) PF 100 MG/5ML IV SOSY
PREFILLED_SYRINGE | INTRAVENOUS | Status: DC | PRN
  Administered 2021-07-01: 60 mg via INTRATRACHEAL

## 2021-07-01 MED ORDER — LACTATED RINGERS IV SOLN: INTRAVENOUS | Status: DC | PRN

## 2021-07-01 MED ORDER — PROPOFOL 10 MG/ML IV BOLUS
INTRAVENOUS | Status: DC | PRN
Start: 2021-07-01 — End: 2021-07-01
  Administered 2021-07-01: 100 mg via INTRAVENOUS

## 2021-07-01 MED ORDER — LACTATED RINGERS IV SOLN: INTRAVENOUS | Status: DC

## 2021-07-01 MED ORDER — PROPOFOL 500 MG/50ML IV EMUL
INTRAVENOUS | Status: DC | PRN
  Administered 2021-07-01: 150 ug/kg/min via INTRAVENOUS

## 2021-07-01 NOTE — Op Note (Signed)
[facility] ?Patient Name: Ryan [name] ?Procedure Date: 07/01/2021 11:46 AM ?MRN: [id number] ?Date of Birth: [birth year] ?Attending MD: Elon Alas. Abbey Chatters , DO ?CSN: [id number] ?Age: 56 ?Admit Type: Outpatient ?Procedure:                Colonoscopy ?Indications:              Screening for colorectal malignant neoplasm ?Providers:                Elon Alas. Abbey Chatters, DO, Selena Lesser RN, RN,  ?                          Randa Spike, Technician ?Referring MD:              ?Medicines:                See the Anesthesia note for documentation of the  ?                          administered medications ?Complications:            No immediate complications. ?Estimated Blood Loss:     Estimated blood loss was minimal. ?Procedure:                Pre-Anesthesia Assessment: ?                          - The anesthesia plan was to use monitored  ?                          anesthesia care (MAC). ?                          After obtaining informed consent, the colonoscope  ?                          was passed under direct vision. Throughout the  ?                          procedure, the patient's blood pressure, pulse, and  ?                          oxygen saturations were monitored continuously. The  ?                          [id number]-HQ190L ([id number]) scope was introduced through  ?                          the anus and advanced to the the cecum, identified  ?                          by appendiceal orifice and ileocecal valve. The  ?                          colonoscopy was performed without difficulty. The  ?                          patient tolerated the procedure well.  The quality  ?                          of the bowel preparation was evaluated using the  ?                          BBPS [facility] Bowel Preparation Scale) with scores  ?                          of: Right Colon = 3, Transverse Colon = 3 and Left  ?                          Colon = 3 (entire mucosa seen well with no residual  ?                           staining, small fragments of stool or opaque  ?                          liquid). The total BBPS score equals 9. ?Scope In: 11:48:20 AM ?Scope Out: 12:03:33 PM ?Scope Withdrawal Time: 0 hours 13 minutes 19 seconds  ?Total Procedure Duration: 0 hours 15 minutes 13 seconds  ?Findings: ?     The perianal and digital rectal examinations were normal. ?     Non-bleeding internal hemorrhoids were found during endoscopy. ?     A 4 mm polyp was found in the transverse colon. The polyp was sessile.  ?     The polyp was removed with a cold snare. Resection and retrieval were  ?     complete. ?     Three sessile polyps were found in the sigmoid colon. The polyps were 2  ?     to 4 mm in size. These polyps were removed with a cold snare. Resection  ?     and retrieval were complete. ?     The exam was otherwise without abnormality. ?Impression:               - Non-bleeding internal hemorrhoids. ?                          - One 4 mm polyp in the transverse colon, removed  ?                          with a cold snare. Resected and retrieved. ?                          - Three 2 to 4 mm polyps in the sigmoid colon,  ?                          removed with a cold snare. Resected and retrieved. ?                          - The examination was otherwise normal. ?Moderate Sedation: ?     Per Anesthesia Care ?Recommendation:           - Patient has a contact number available for  ?  emergencies. The signs and symptoms of potential  ?                          delayed complications were discussed with the  ?                          patient. Return to normal activities tomorrow.  ?                          Written discharge instructions were provided to the  ?                          patient. ?                          - Resume previous diet. ?                          - Continue present medications. ?                          - Await pathology results. ?                          - Repeat colonoscopy in 5-10 years  for surveillance. ?                          - Return to GI clinic PRN. ?Procedure Code(s):        --- Professional --- ?                          [phone], Colonoscopy, flexible; with removal of  ?                          tumor(s), polyp(s), or other lesion(s) by snare  ?                          technique ?Diagnosis Code(s):        --- Professional --- ?                          K63.5, Polyp of colon ?                          Z12.11, Encounter for screening for malignant  ?                          neoplasm of colon ?                          K64.8, Other hemorrhoids ?CPT copyright 2019 American Medical Association. All rights reserved. ?The codes documented in this report are preliminary and upon coder review may  ?be revised to meet current compliance requirements. ?Elon Alas. Abbey Chatters, DO ?Elon Alas. [place], DO ?07/01/2021 12:05:23 PM ?This report has been signed electronically. ?Number of Addenda: 0 ?

## 2021-07-01 NOTE — Anesthesia Preprocedure Evaluation (Signed)
Anesthesia Evaluation  Patient identified by MRN, date of birth, ID band Patient awake    Reviewed: Allergy & Precautions, H&P , NPO status , Patient's Chart, lab work & pertinent test results, reviewed documented beta blocker date and time   Airway Mallampati: II  TM Distance: >3 FB Neck ROM: full    Dental no notable dental hx.    Pulmonary neg pulmonary ROS, Current Smoker,    Pulmonary exam normal breath sounds clear to auscultation       Cardiovascular Exercise Tolerance: Good hypertension, negative cardio ROS   Rhythm:regular Rate:Normal     Neuro/Psych PSYCHIATRIC DISORDERS Anxiety negative neurological ROS     GI/Hepatic negative GI ROS, Neg liver ROS,   Endo/Other  negative endocrine ROS  Renal/GU negative Renal ROS  negative genitourinary   Musculoskeletal   Abdominal   Peds  Hematology negative hematology ROS (+)   Anesthesia Other Findings   Reproductive/Obstetrics negative OB ROS                             Anesthesia Physical Anesthesia Plan  ASA: 2  Anesthesia Plan: General   Post-op Pain Management:    Induction:   PONV Risk Score and Plan: Propofol infusion  Airway Management Planned:   Additional Equipment:   Intra-op Plan:   Post-operative Plan:   Informed Consent: I have reviewed the patients History and Physical, chart, labs and discussed the procedure including the risks, benefits and alternatives for the proposed anesthesia with the patient or authorized representative who has indicated his/her understanding and acceptance.     Dental Advisory Given  Plan Discussed with: CRNA  Anesthesia Plan Comments:         Anesthesia Quick Evaluation  

## 2021-07-01 NOTE — Anesthesia Postprocedure Evaluation (Signed)
Anesthesia Post Note ? ?Patient: ANDRUS SHARP ? ?Procedure(s) Performed: COLONOSCOPY WITH PROPOFOL ?ESOPHAGOGASTRODUODENOSCOPY (EGD) WITH PROPOFOL ?BIOPSY ? ?Patient location during evaluation: Phase II ?Anesthesia Type: General ?Level of consciousness: awake ?Pain management: pain level controlled ?Vital Signs Assessment: post-procedure vital signs reviewed and stable ?Respiratory status: spontaneous breathing and respiratory function stable ?Cardiovascular status: blood pressure returned to baseline and stable ?Postop Assessment: no headache and no apparent nausea or vomiting ?Anesthetic complications: no ?Comments: Late entry ? ? ?No notable events documented. ? ? ?Last Vitals:  ?Vitals:  ? 07/01/21 1107 07/01/21 1209  ?BP: (!) 148/84 122/61  ?Pulse: 67   ?Resp: 13 15  ?Temp: 36.7 ?C 36.4 ?C  ?SpO2: 100% 96%  ?  ?Last Pain:  ?Vitals:  ? 07/01/21 1209  ?TempSrc: Oral  ?PainSc: 0-No pain  ? ? ?  ?  ?  ?  ?  ?  ? ?[place] ? ? ? ? ?

## 2021-07-01 NOTE — Transfer of Care (Signed)
Immediate Anesthesia Transfer of Care Note ? ?Patient: Ryan [name] ? ?Procedure(s) Performed: COLONOSCOPY WITH PROPOFOL ?ESOPHAGOGASTRODUODENOSCOPY (EGD) WITH PROPOFOL ?BIOPSY ? ?Patient Location: PACU ? ?Anesthesia Type:General ? ?Level of Consciousness: awake ? ?Airway & Oxygen Therapy: Patient Spontanous Breathing ? ?Post-op Assessment: Report given to RN and Post -op Vital signs reviewed and stable ? ?Post vital signs: Reviewed and stable ? ?Last Vitals:  ?Vitals Value Taken Time  ?BP 122/61 07/01/21 1209  ?Temp 36.4 ?C 07/01/21 1209  ?Pulse    ?Resp 15 07/01/21 1209  ?SpO2 96 % 07/01/21 1209  ? ? ?Last Pain:  ?Vitals:  ? 07/01/21 1209  ?TempSrc: Oral  ?PainSc: 0-No pain  ?   ? ?  ? ?Complications: No notable events documented. ?

## 2021-07-01 NOTE — Op Note (Signed)
[facility] ?Patient Name: Ryan [name] ?Procedure Date: 07/01/2021 11:14 AM ?MRN: [id number] ?Date of Birth: [birth year] ?Attending MD: Elon Alas. Abbey Chatters , DO ?CSN: [id number] ?Age: 56 ?Admit Type: Outpatient ?Procedure:                Upper GI endoscopy ?Indications:              Screening procedure, Screening for Barrett's  ?                          esophagus ?Providers:                Elon Alas. Abbey Chatters, DO, Charlsie Quest. Theda Sers RN, Therapist, sports,  ?                          Randa Spike, Technician ?Referring MD:              ?Medicines:                See the Anesthesia note for documentation of the  ?                          administered medications ?Complications:            No immediate complications. ?Estimated Blood Loss:     Estimated blood loss was minimal. ?Procedure:                Pre-Anesthesia Assessment: ?                          - The anesthesia plan was to use monitored  ?                          anesthesia care (MAC). ?                          After obtaining informed consent, the endoscope was  ?                          passed under direct vision. Throughout the  ?                          procedure, the patient's blood pressure, pulse, and  ?                          oxygen saturations were monitored continuously. The  ?                          GIF-H190 ([id number]) scope was introduced through the  ?                          mouth, and advanced to the second part of duodenum.  ?                          The upper GI endoscopy was accomplished without  ?                          difficulty. The patient tolerated the procedure  ?  well. ?Scope In: 11:38:45 AM ?Scope Out: 11:43:53 AM ?Total Procedure Duration: 0 hours 5 minutes 8 seconds  ?Findings: ?     The Z-line was regular and was found 40 cm from the incisors. ?     There is no endoscopic evidence of esophagitis, inflammation,  ?     ulcerations or varices in the entire esophagus. ?     A widely patent and  non-obstructing Schatzki ring was found at the  ?     gastroesophageal junction. ?     A small hiatal hernia was present. ?     The entire examined stomach was normal. ?     Localized nodular mucosa was found in the duodenal bulb. 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 Biopsies were taken with a cold forceps for histology. ?Impression:               - Z-line regular, 40 cm from the incisors. ?                          - Widely patent and non-obstructing Schatzki ring. ?                          - Small hiatal hernia. ?                          - Normal stomach. ?                          - Nodular mucosa in the duodenal bulb. Biopsied. ?Moderate Sedation: ?     Per Anesthesia Care ?Recommendation:           - Patient has a contact number available for  ?                          emergencies. The signs and symptoms of potential  ?                          delayed complications were discussed with the  ?                          patient. Return to normal activities tomorrow.  ?                          Written discharge instructions were provided to the  ?                          patient. ?                          - Resume previous diet. ?                          - Continue present medications. ?                          - Await pathology results. ?                          - Use a  proton pump inhibitor PO daily. ?Procedure Code(s):        --- Professional --- ?                          [phone], Esophagogastroduodenoscopy, flexible,  ?                          transoral; with biopsy, single or multiple ?Diagnosis Code(s):        --- Professional --- ?                          K22.2, Esophageal obstruction ?                          K44.9, Diaphragmatic hernia without obstruction or  ?                          gangrene ?                          K31.89, Other diseases of stomach and duodenum ?                          Z13.810, Encounter for screening for upper  ?                          gastrointestinal disorder ?CPT  copyright 2019 American Medical Association. All rights reserved. ?The codes documented in this report are preliminary and upon coder review may  ?be revised to meet current compliance requirements. ?Elon Alas. Abbey Chatters, DO ?Elon Alas. [place], DO ?07/01/2021 11:46:47 AM ?This report has been signed electronically. ?Number of Addenda: 0 ?

## 2021-07-01 NOTE — Discharge Instructions (Addendum)
EGD ?Discharge instructions ?Please read the instructions outlined below and refer to this sheet in the next few weeks. These discharge instructions provide you with general information on caring for yourself after you leave the hospital. Your doctor may also give you specific instructions. While your treatment has been planned according to the most current medical practices available, unavoidable complications occasionally occur. If you have any problems or questions after discharge, please call your doctor. ?ACTIVITY ?You may resume your regular activity but move at a slower pace for the next 24 hours.  ?Take frequent rest periods for the next 24 hours.  ?Walking will help expel (get rid of) the air and reduce the bloated feeling in your abdomen.  ?No driving for 24 hours (because of the anesthesia (medicine) used during the test).  ?You may shower.  ?Do not sign any important legal documents or operate any machinery for 24 hours (because of the anesthesia used during the test).  ?NUTRITION ?Drink plenty of fluids.  ?You may resume your normal diet.  ?Begin with a light meal and progress to your normal diet.  ?Avoid alcoholic beverages for 24 hours or as instructed by your caregiver.  ?MEDICATIONS ?You may resume your normal medications unless your caregiver tells you otherwise.  ?WHAT YOU CAN EXPECT TODAY ?You may experience abdominal discomfort such as a feeling of fullness or ?gas? pains.  ?FOLLOW-UP ?Your doctor will discuss the results of your test with you.  ?SEEK IMMEDIATE MEDICAL ATTENTION IF ANY OF THE FOLLOWING OCCUR: ?Excessive nausea (feeling sick to your stomach) and/or vomiting.  ?Severe abdominal pain and distention (swelling).  ?Trouble swallowing.  ?Temperature over 101? F (37.8? C).  ?Rectal bleeding or vomiting of blood.  ? ? ? ?Colonoscopy ?Discharge Instructions ? ?Read the instructions outlined below and refer to this sheet in the next few weeks. These discharge instructions provide you with  general information on caring for yourself after you leave the hospital. Your doctor may also give you specific instructions. While your treatment has been planned according to the most current medical practices available, unavoidable complications occasionally occur.  ? ?ACTIVITY ?You may resume your regular activity, but move at a slower pace for the next 24 hours.  ?Take frequent rest periods for the next 24 hours.  ?Walking will help get rid of the air and reduce the bloated feeling in your belly (abdomen).  ?No driving for 24 hours (because of the medicine (anesthesia) used during the test).   ?Do not sign any important legal documents or operate any machinery for 24 hours (because of the anesthesia used during the test).  ?NUTRITION ?Drink plenty of fluids.  ?You may resume your normal diet as instructed by your doctor.  ?Begin with a light meal and progress to your normal diet. Heavy or fried foods are harder to digest and may make you feel sick to your stomach (nauseated).  ?Avoid alcoholic beverages for 24 hours or as instructed.  ?MEDICATIONS ?You may resume your normal medications unless your doctor tells you otherwise.  ?WHAT YOU CAN EXPECT TODAY ?Some feelings of bloating in the abdomen.  ?Passage of more gas than usual.  ?Spotting of blood in your stool or on the toilet paper.  ?IF YOU HAD POLYPS REMOVED DURING THE COLONOSCOPY: ?No aspirin products for 7 days or as instructed.  ?No alcohol for 7 days or as instructed.  ?Eat a soft diet for the next 24 hours.  ?FINDING OUT THE RESULTS OF YOUR TEST ?Not all test results are  available during your visit. If your test results are not back during the visit, make an appointment with your caregiver to find out the results. Do not assume everything is normal if you have not heard from your caregiver or the medical facility. It is important for you to follow up on all of your test results.  ?SEEK IMMEDIATE MEDICAL ATTENTION IF: ?You have more than a spotting of  blood in your stool.  ?Your belly is swollen (abdominal distention).  ?You are nauseated or vomiting.  ?You have a temperature over 101.  ?You have abdominal pain or discomfort that is severe or gets worse throughout the day.  ? ?Your EGD was negative for Barrett's esophagus.  You do have a small hiatal hernia.  Stomach appeared normal.  Your small bowel was slightly nodular which I biopsied today.  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 ? ?Continue on omeprazole daily as a believe this is helping your chronic acid reflux. ? ?Your colonoscopy revealed 4 polyp(s) which I removed successfully.  These appear to be benign, hyperplastic type.  Await pathology results, my office will contact you. I recommend repeating colonoscopy in 5-10 years for surveillance purposes depending on path results.  ? ?Otherwise follow up with GI as needed.   ? ? ?I hope you have a great rest of your week! ? ?Hennie Duos. Marletta Lor, D.O. ?Gastroenterology and Hepatology ?[facility] ? ?

## 2021-07-01 NOTE — Interval H&P Note (Signed)
History and Physical Interval Note: ? ?07/01/2021 ?11:33 AM ? ?Ryan [name]  has presented today for surgery, with the diagnosis of CA Screening, gerd.  The various methods of treatment have been discussed with the patient and family. After consideration of risks, benefits and other options for treatment, the patient has consented to  Procedure(s) with comments: ?COLONOSCOPY WITH PROPOFOL (N/A) - 1:45pm, asa 2 ?ESOPHAGOGASTRODUODENOSCOPY (EGD) WITH PROPOFOL (N/A) as a surgical intervention.  The patient's history has been reviewed, patient examined, no change in status, stable for surgery.  I have reviewed the patient's chart and labs.  Questions were answered to the patient's satisfaction.   ? ? ?Eloise Harman ? ? ?

## 2021-07-04 LAB — SURGICAL PATHOLOGY

## 2021-07-05 ENCOUNTER — Encounter (HOSPITAL_COMMUNITY): Payer: Self-pay | Admitting: Internal Medicine

## 2021-11-23 IMAGING — MR MR HEAD WO/W CM
12 of 14 series · 36 of 48 positions shown · IV contrast (10 ml Gadavist)
Comparison: None.

CLINICAL DATA: Weakness and difficulty speaking for 3 weeks

EXAM:
MRI HEAD WITHOUT AND WITH CONTRAST
TECHNIQUE: Multiplanar, multiecho pulse sequences of the brain and surrounding
structures were obtained without and with intravenous contrast.
CONTRAST:  10mL GADAVIST GADOBUTROL 1 MMOL/ML IV SOLN

[Series 5: DWI · axial · 4.0mm · 0.88mm/px · z∈[-2,+134]mm · 3 of 36 slices shown (1 of 6)]
[im 1/36]
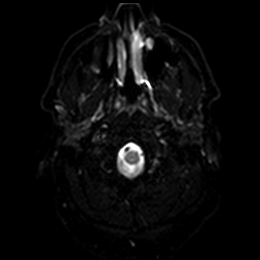
[im 18/36]
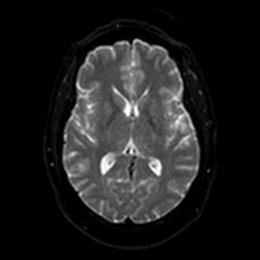
[im 36/36]
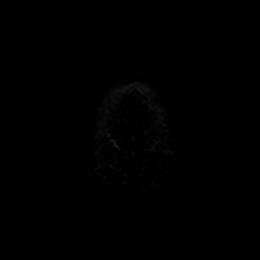

[Series 5: DWI · axial · 4.0mm · 0.88mm/px · z∈[-2,+134]mm · 3 of 36 slices shown (2 of 6)]
[im 1/36]
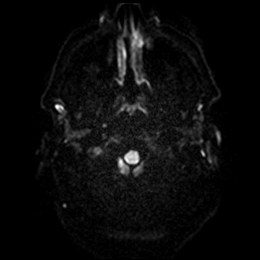
[im 18/36]
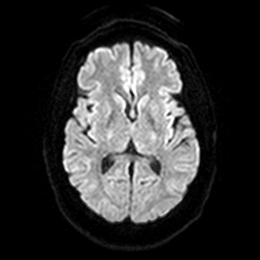
[im 36/36]
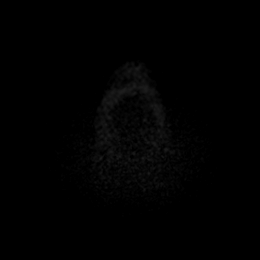

[Series 6: DWI · axial · 4.0mm · 0.88mm/px · z∈[-2,+134]mm · 4 of 36 slices shown (3 of 6)]
[im 1/36]
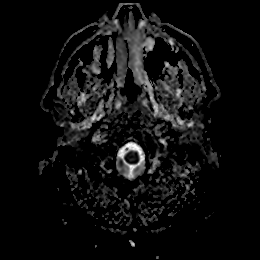
[im 12/36]
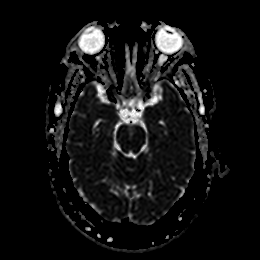
[im 24/36]
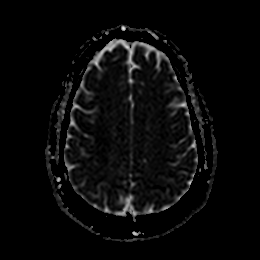
[im 36/36]
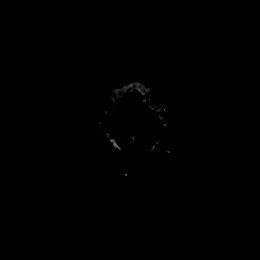

[Series 7: DWI · coronal · 5.0mm · 0.88mm/px · 3 of 28 slices shown (4 of 6)]
[im 1/28]
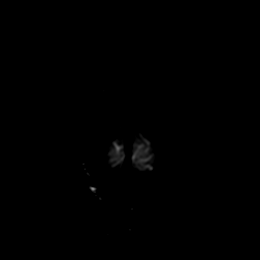
[im 14/28]
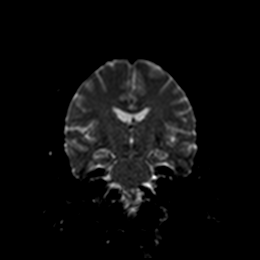
[im 28/28]
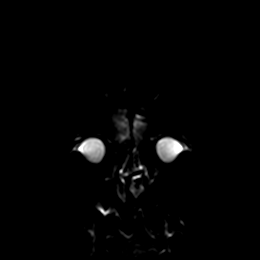

[Series 7: DWI · coronal · 5.0mm · 0.88mm/px · 3 of 28 slices shown (5 of 6)]
[im 1/28]
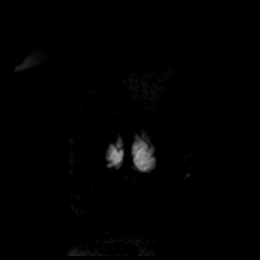
[im 14/28]
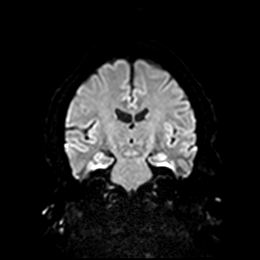
[im 28/28]
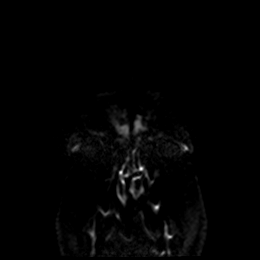

[Series 8: DWI · coronal · 5.0mm · 0.88mm/px · 3 of 28 slices shown (6 of 6)]
[im 1/28]
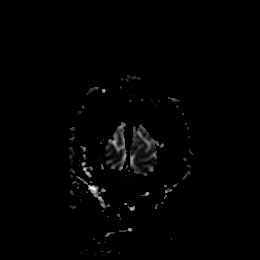
[im 14/28]
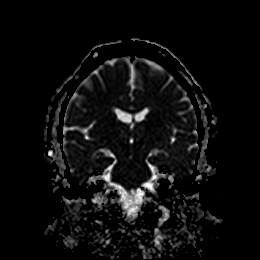
[im 28/28]
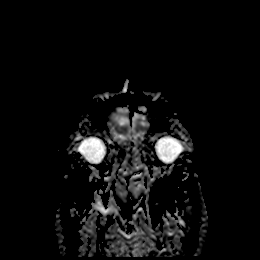

[Series 9: T1 · sagittal · 5.0mm · 0.94mm/px · 3 of 25 slices shown]
[im 1/25]
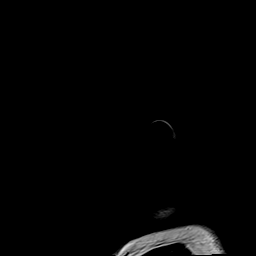
[im 13/25]
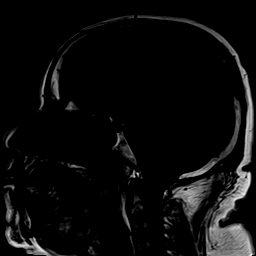
[im 25/25]
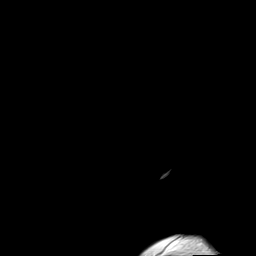

[Series 10: T2 · axial · 5.0mm · 0.72mm/px · z∈[+1,+131]mm · 2 of 20 slices shown (1 of 2)]
[im 1/20]
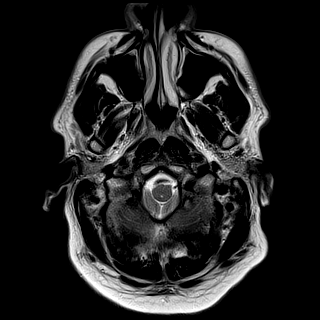
[im 20/20]
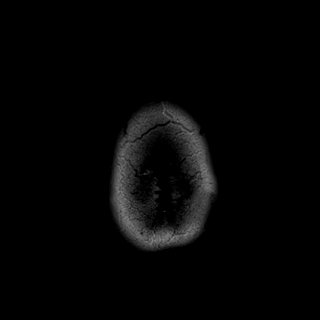

[Series 11: ax hemo · axial · 5.0mm · 0.86mm/px · z∈[-6,+134]mm · 3 of 25 slices shown]
[im 1/25]
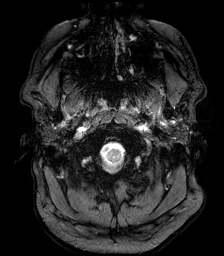
[im 13/25]
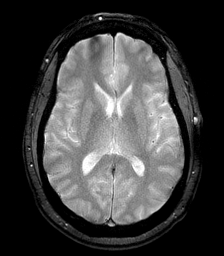
[im 25/25]
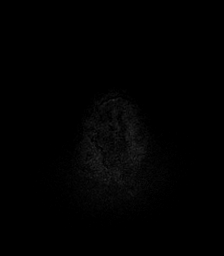

[Series 12: FLAIR · axial · 4.0mm · 0.43mm/px · z∈[+4,+125]mm · 3 of 32 slices shown]
[im 1/32]
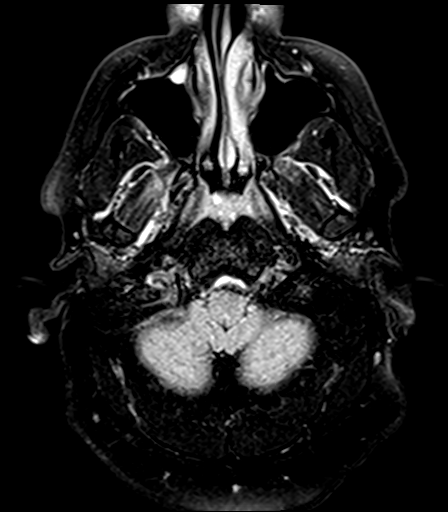
[im 16/32]
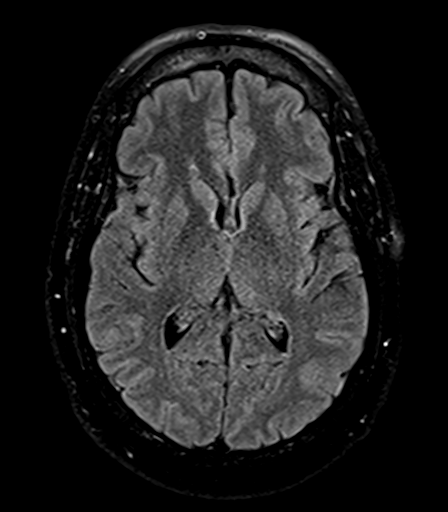
[im 32/32]
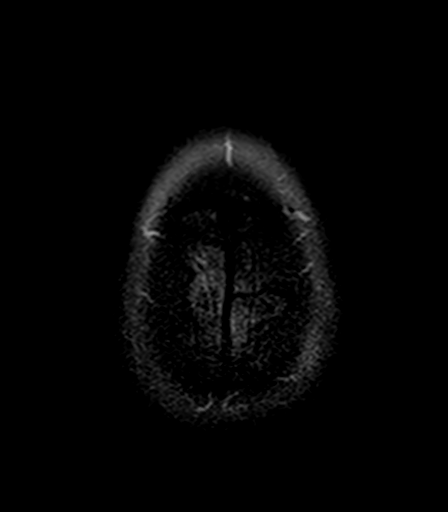

[Series 14: T2 · coronal · 5.0mm · 0.72mm/px · 3 of 28 slices shown (2 of 2)]
[im 1/28]
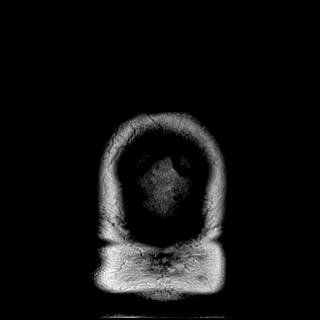
[im 14/28]
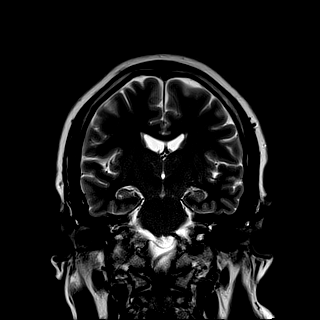
[im 28/28]
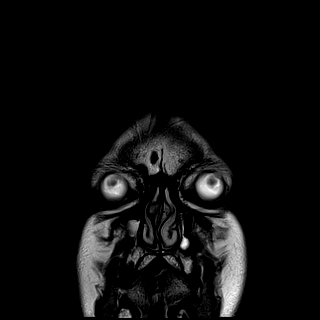

[Series 16: T1 post-contrast · coronal · 5.0mm · 0.34mm/px · 3 of 29 slices shown]
[im 1/29]
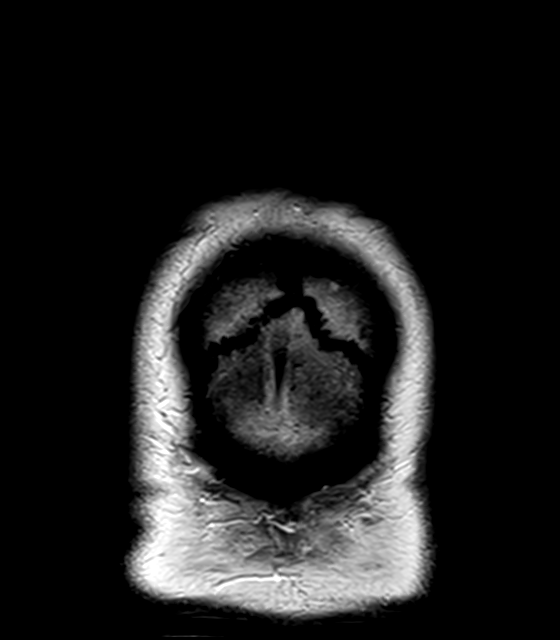
[im 15/29]
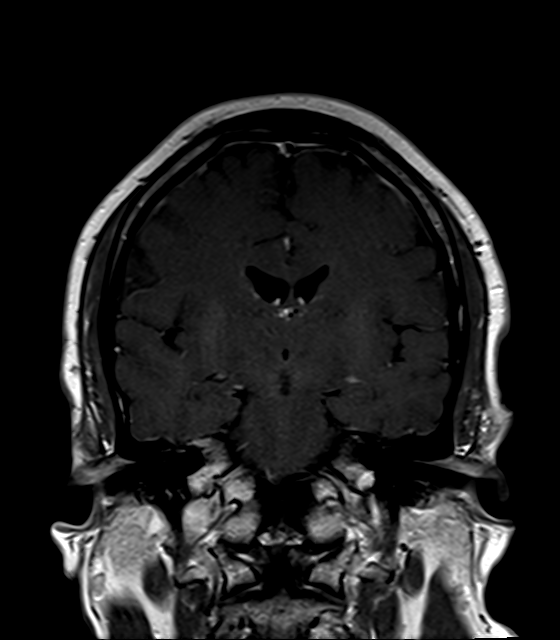
[im 29/29]
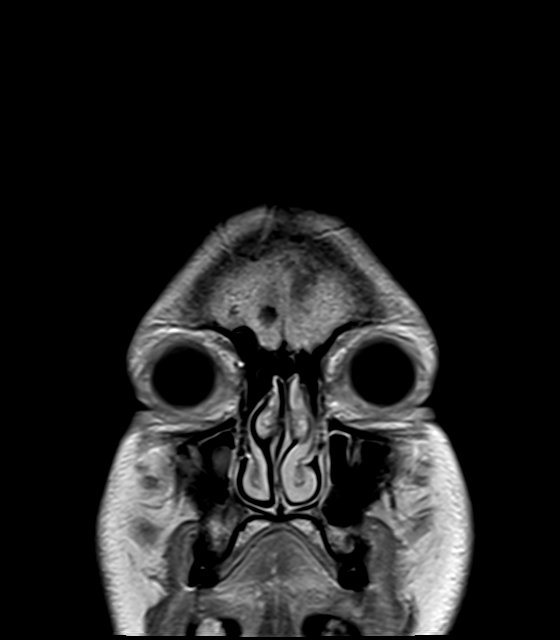

[36 of 48 positions shown; findings below may reference images not displayed]

FINDINGS: Brain: No acute infarct, mass effect or extra-axial collection. No
acute or chronic hemorrhage. Normal white matter signal, parenchymal
volume and CSF spaces. The midline structures are normal.

Vascular: Major flow voids are preserved.

Skull and upper cervical spine: Normal calvarium and skull base.
Visualized upper cervical spine and soft tissues are normal.

Sinuses/Orbits:No paranasal sinus fluid levels or advanced mucosal
thickening. No mastoid or middle ear effusion. Normal orbits.
IMPRESSION: Normal brain MRI.

## 2021-12-19 ENCOUNTER — Institutional Professional Consult (permissible substitution): Payer: No Typology Code available for payment source | Admitting: Pulmonary Disease

## 2022-01-26 ENCOUNTER — Encounter: Payer: Self-pay | Admitting: Internal Medicine

## 2022-01-26 ENCOUNTER — Ambulatory Visit (INDEPENDENT_AMBULATORY_CARE_PROVIDER_SITE_OTHER): Payer: No Typology Code available for payment source | Admitting: Internal Medicine

## 2022-01-26 ENCOUNTER — Ambulatory Visit (HOSPITAL_COMMUNITY)
Admission: RE | Admit: 2022-01-26 | Discharge: 2022-01-26 | Disposition: A | Discharge status: Home / Self Care | Payer: No Typology Code available for payment source | Source: Ambulatory Visit | Attending: Internal Medicine | Admitting: Internal Medicine

## 2022-01-26 VITALS — BP 136/84 | HR 68 | Temp 98.2°F | Ht 66.0 in | Wt 196.0 lb

## 2022-01-26 DIAGNOSIS — R058 Other specified cough: Secondary | ICD-10-CM | POA: Insufficient documentation

## 2022-01-26 DIAGNOSIS — Z23 Encounter for immunization: Secondary | ICD-10-CM

## 2022-01-26 DIAGNOSIS — I10 Essential (primary) hypertension: Secondary | ICD-10-CM | POA: Diagnosis not present

## 2022-01-26 DIAGNOSIS — F17210 Nicotine dependence, cigarettes, uncomplicated: Secondary | ICD-10-CM | POA: Diagnosis not present

## 2022-01-26 MED ORDER — BENZONATATE 200 MG PO CAPS: 200.0000 mg | ORAL_CAPSULE | Freq: Three times a day (TID) | ORAL | 1 refills | Status: DC | PRN

## 2022-01-26 MED ORDER — VALSARTAN 160 MG PO TABS: 160.0000 mg | ORAL_TABLET | Freq: Every day | ORAL | 11 refills | Status: AC

## 2022-01-26 NOTE — Patient Instructions (Addendum)
Change prilosec (omeprazole)  40 mg Take 30- 60 min before your first and last meals of the day and take Pepcid (famotidine) 20 mg after supper   GERD (REFLUX)  is an extremely common cause of respiratory symptoms just like yours , many times with no obvious heartburn at all.    It can be treated with medication, but also with lifestyle changes including elevation of the head of your bed (ideally with 6 -8inch blocks under the headboard of your bed),  Smoking cessation, avoidance of late meals, excessive alcohol, and avoid fatty foods, chocolate, peppermint, colas, red wine, and acidic juices such as orange juice.  NO MINT OR MENTHOL PRODUCTS SO NO COUGH DROPS  USE SUGARLESS CANDY INSTEAD (Jolley ranchers or Stover's or Life Savers) or even ice chips will also do - the key is to swallow to prevent all throat clearing. NO OIL BASED VITAMINS - use powdered substitutes.  Avoid fish oil when coughing.   For cough > tessalon 200 mg every 4- 6 hours as needed   Stop losartan and start valsartan 160 mg one daily    Please remember to go to the lab department   for your tests - we will call you with the results when they are available.     Please remember to go to the  x-ray department  @  [facility] for your tests - we will call you with the results when they are available     Please schedule a follow up office visit in 6 weeks, call sooner if needed

## 2022-01-26 NOTE — Progress Notes (Signed)
Ryan [name], male    DOB: [birth year]    MRN: [id number]   Brief patient profile:  56   yobm  active smoker/ [employer] limited by dm/neuropathy  referred to pulmonary clinic in [place]  01/26/2022 by [place] for cough > Sob x 10 years year round worse in heat      History of Present Illness  01/26/2022  Pulmonary/ 1st office eval/ Ryan [name] / [name]  Chief Complaint  Patient presents with   Consult    Consult for cough. 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     Also denies any obvious fluctuation of symptoms with weather or environmental changes or other aggravating or alleviating factors except as outlined above   No unusual exposure hx or h/o childhood pna/ asthma or knowledge of premature birth.  Current Allergies, Complete Past Medical History, Past Surgical History, Family History, and Social History were reviewed in [name] record.  ROS  The following are not active complaints unless bolded Hoarseness, sore throat, dysphagia, dental problems, itching, sneezing,  nasal congestion or discharge of excess mucus or purulent secretions, ear ache,   fever, chills, sweats, unintended wt loss or wt gain, classically pleuritic or exertional cp,  orthopnea pnd or arm/hand swelling  or leg swelling, presyncope, palpitations, abdominal pain, anorexia, nausea, vomiting, diarrhea  or change in bowel habits or change in bladder habits, change in stools or change in urine, dysuria, hematuria,  rash, arthralgias, visual complaints, headache,  numbness, weakness or ataxia or problems with walking or coordination,  change in mood or  memory.           Past Medical History:  Diagnosis Date   Chronic back pain    Hypertension    PTSD (post-traumatic stress disorder)     Outpatient Medications Prior to Visit  Medication Sig Dispense Refill   amLODipine (NORVASC) 10 MG tablet Take 10 mg by mouth daily.     atorvastatin (LIPITOR) 40 MG tablet Take 40 mg by mouth at bedtime.     diclofenac Sodium (VOLTAREN) 1 % GEL Apply 2 g topically 3 (three) times daily as needed (knee pain).     losartan (COZAAR) 100 MG tablet Take 100 mg by mouth daily.     metoprolol tartrate (LOPRESSOR) 50 MG tablet Take 25-50 mg by mouth See admin instructions. Take 50 mg by mouth in the morning and  25 mg at night     omeprazole (PRILOSEC) 40 MG capsule Take 40 mg by mouth daily.     Polyethyl Glycol-Propyl Glycol (SYSTANE OP) Place 1 drop into both eyes 2 (two) times daily as needed (dry eyes).     isosorbide mononitrate (IMDUR) 30 MG 24 hr tablet Take 1 tablet (30 mg total) by mouth daily. (Patient not taking: Reported on 06/21/2021) 30 tablet 6   No facility-administered medications prior to visit.     Objective:     BP 136/84 (BP Location: Left Arm, Patient Position: Sitting)   Pulse 68  Temp 98.2 F (36.8 C) (Temporal)   Ht 5\' 6"  (1.676 m)   Wt 196 lb (88.9 kg)   SpO2 100% Comment: ra  BMI 31.64 kg/m   SpO2: 100 % (ra)  Amb pleasant healthy appearing bm nad / freq throat clearing   HEENT : Oropharynx  clear     Nasal turbinates mild edema/ non-specific    NECK :  without  apparent JVD/ palpable Nodes/TM    LUNGS: no acc muscle use,  Nl contour chest which is clear to A and P bilaterally with cough on insp     CV:  RRR  no s3 or murmur or increase in P2, and no edema   ABD:  soft and nontender with nl inspiratory excursion in the supine position. No bruits or organomegaly appreciated   MS:  Nl gait/ ext warm without deformities  Or obvious joint restrictions  calf tenderness, cyanosis or clubbing    SKIN: warm and dry without lesions    NEURO:  alert, approp, nl sensorium with  no motor or cerebellar deficits apparent.   CXR PA and Lateral:   01/26/2022 :    I personally reviewed images and agree with radiology impression as follows:   wnl       Assessment   Upper airway cough syndrome Active smoker/ onset x around 2013 with overt HB - Allergy screen 01/26/2022 >  Eos 0. /  IgE  Pending  - 01/26/2022 rec max gerd rx and change losartan to valsartan and f/u 6 wks  Upper airway cough syndrome (previously labeled PNDS),  is so named because it's frequently impossible to sort out how much is  CR/sinusitis with freq throat clearing (which can be related to primary GERD)   vs  causing  secondary (" extra esophageal")  GERD from wide swings in gastric pressure that occur with throat clearing, often  promoting self use of mint and menthol lozenges that reduce the lower esophageal sphincter tone and exacerbate the problem further in a cyclical fashion.   These are the same pts (now being labeled as having "irritable larynx syndrome" by some cough centers) who not infrequently have a history of having failed to tolerate [name] inhibitors(and occ generic losartan, see below) ,  dry powder inhalers or biphosphonates or report having atypical/extraesophageal reflux symptoms that don't respond to standard doses of PPI  and are easily confused as having aecopd or asthma flares by even experienced allergists/ pulmonologists (myself included).   Will check allergy screen and f/u in 6 weeks, sooner if needed   Essential hypertension Changed losartan to valsartan 160 mg daily 01/26/2022   For reasons that may related to vascular permability and nitric oxide pathways but not elevated  bradykinin levels (as seen with  ACEi use) losartan in the generic form has been reported now from mulitple sources  to cause a similar pattern of  non-specific  upper airway symptoms as seen with acei.   This has not been reported with exposure to the other ARB's to date, so it seems reasonable for now to try either generic diovan or avapro if ARB needed or use an alternative class altogether.  See:  Lelon Frohlich Allergy Asthma Immunol  2008: 101: p [phone]      Cigarette smoker Counseled re importance of smoking cessation but did not meet time criteria for separate billing    Ideally needs f/u pfts as he is beginning to note doe though very mild  >>>    lung cancer screening (  thru [place]) also needed and will address at f/u ov          Each maintenance medication was reviewed in detail including emphasizing most importantly the difference between maintenance and prns and under what circumstances the prns are to be triggered using an action plan format where appropriate.  Total time for H and P, chart review, counseling,   and generating customized AVS unique to this office visit / same day charting  > 45 min with pt new to me          Sandrea Hughs, MD 01/26/2022

## 2022-01-27 ENCOUNTER — Encounter: Payer: Self-pay | Admitting: Internal Medicine

## 2022-01-27 NOTE — Assessment & Plan Note (Signed)
Changed losartan to valsartan 160 mg daily 01/26/2022   For reasons that may related to vascular permability and nitric oxide pathways but not elevated  bradykinin levels (as seen with  ACEi use) losartan in the generic form has been reported now from mulitple sources  to cause a similar pattern of non-specific  upper airway symptoms as seen with acei.   This has not been reported with exposure to the other ARB's to date, so it seems reasonable for now to try either generic diovan or avapro if ARB needed or use an alternative class altogether.  See:  Lelon Frohlich Allergy Asthma Immunol  2008: 101: p [phone]

## 2022-01-27 NOTE — Assessment & Plan Note (Addendum)
Active smoker/ onset x around 2013 with overt HB - Allergy screen 01/26/2022 >  Eos 0. /  IgE  Pending  - 01/26/2022 rec max gerd rx and change losartan to valsartan and f/u 6 wks  Upper airway cough syndrome (previously labeled PNDS),  is so named because it's frequently impossible to sort out how much is  CR/sinusitis with freq throat clearing (which can be related to primary GERD)   vs  causing  secondary (" extra esophageal")  GERD from wide swings in gastric pressure that occur with throat clearing, often  promoting self use of mint and menthol lozenges that reduce the lower esophageal sphincter tone and exacerbate the problem further in a cyclical fashion.   These are the same pts (now being labeled as having "irritable larynx syndrome" by some cough centers) who not infrequently have a history of having failed to tolerate ace inhibitors(and occ generic losartan, see below) ,  dry powder inhalers or biphosphonates or report having atypical/extraesophageal reflux symptoms that don't respond to standard doses of PPI  and are easily confused as having aecopd or asthma flares by even experienced allergists/ pulmonologists (myself included).   Will check allergy screen and f/u in 6 weeks, sooner if needed

## 2022-01-27 NOTE — Assessment & Plan Note (Addendum)
Counseled re importance of smoking cessation but did not meet time criteria for separate billing    Ideally needs f/u pfts as he is beginning to note doe though very mild  >>>    lung cancer screening (thru [place]) also needed and will address at f/u ov          Each maintenance medication was reviewed in detail including emphasizing most importantly the difference between maintenance and prns and under what circumstances the prns are to be triggered using an action plan format where appropriate.  Total time for H and P, chart review, counseling,   and generating customized AVS unique to this office visit / same day charting  > 45 min with pt new to me

## 2022-01-30 LAB — CBC WITH DIFFERENTIAL/PLATELET
Basophils Absolute: 0.2 10*3/uL (ref 0.0–0.2)
Basos: 2 %
EOS (ABSOLUTE): 0.5 10*3/uL — ABNORMAL HIGH (ref 0.0–0.4)
Eos: 6 %
Hematocrit: 43.9 % (ref 37.5–51.0)
Hemoglobin: 14.7 g/dL (ref 13.0–17.7)
Immature Grans (Abs): 0 10*3/uL (ref 0.0–0.1)
Immature Granulocytes: 0 %
Lymphocytes Absolute: 3.1 10*3/uL (ref 0.7–3.1)
Lymphs: 38 %
MCH: 30.9 pg (ref 26.6–33.0)
MCHC: 33.5 g/dL (ref 31.5–35.7)
MCV: 92 fL (ref 79–97)
Monocytes Absolute: 0.7 10*3/uL (ref 0.1–0.9)
Monocytes: 8 %
Neutrophils Absolute: 3.6 10*3/uL (ref 1.4–7.0)
Neutrophils: 46 %
Platelets: 345 10*3/uL (ref 150–450)
RBC: 4.76 x10E6/uL (ref 4.14–5.80)
RDW: 13.5 % (ref 11.6–15.4)
WBC: 8.1 10*3/uL (ref 3.4–10.8)

## 2022-01-30 LAB — IGE: IgE (Immunoglobulin E), Serum: 46 IU/mL (ref 6–495)

## 2022-01-31 ENCOUNTER — Telehealth: Payer: Self-pay | Admitting: Internal Medicine

## 2022-01-31 NOTE — Progress Notes (Signed)
Spoke with pt and notified of results per Dr. Wert. Pt verbalized understanding and denied any questions. 

## 2022-01-31 NOTE — Telephone Encounter (Signed)
01/30/2022  8:32 AM EDT Back to Top    Call patient :  Study suggests a mild allergy signal but nothing diagnostic - let's see how he does with recs already made and Be sure patient has/keeps f/u ov so we can go over all the details of this study and get a plan together moving forward - ok to move up f/u if not feeling better and wants to be seen sooner    Spoke with pt and notified of results per Dr. Melvyn Novas. Pt verbalized understanding and denied any questions.

## 2022-03-07 ENCOUNTER — Ambulatory Visit (INDEPENDENT_AMBULATORY_CARE_PROVIDER_SITE_OTHER): Payer: No Typology Code available for payment source | Admitting: Internal Medicine

## 2022-03-07 ENCOUNTER — Encounter: Payer: Self-pay | Admitting: Internal Medicine

## 2022-03-07 VITALS — BP 134/88 | HR 72 | Temp 98.2°F | Ht 66.0 in | Wt 200.0 lb

## 2022-03-07 DIAGNOSIS — F17210 Nicotine dependence, cigarettes, uncomplicated: Secondary | ICD-10-CM | POA: Diagnosis not present

## 2022-03-07 DIAGNOSIS — R058 Other specified cough: Secondary | ICD-10-CM | POA: Diagnosis not present

## 2022-03-07 DIAGNOSIS — I10 Essential (primary) hypertension: Secondary | ICD-10-CM | POA: Diagnosis not present

## 2022-03-07 NOTE — Assessment & Plan Note (Addendum)
Counseled re importance of smoking cessation but did not meet time criteria for separate billing   Low-dose CT lung cancer screening is recommended for patients who are 21-56 years of age with a 20+ pack-year history of smoking and who are currently smoking or quit <=15 years ago. No coughing up blood  No unintentional weight loss of > 15 pounds in the last 6 months - pt is eligible for scanning yearly until 58 y after quit > per [place] PCP         Each maintenance medication was reviewed in detail including emphasizing most importantly the difference between maintenance and prns and under what circumstances the prns are to be triggered using an action plan format where appropriate.  Total time for H and P, chart review, counseling,  and generating customized AVS unique to this office visit / same day charting = 23 min

## 2022-03-07 NOTE — Patient Instructions (Addendum)
Discuss with your primary at [facility] lung cancer screening = annual low dose CT  We will go ahead and schedule PFTS at [facility]  If any trouble with night time or early am cough > bed blocks 6-8 in  The key is to stop smoking completely before smoking completely stops you!    Please schedule a follow up visit in 3 - 4 months but call sooner if needed

## 2022-03-07 NOTE — Assessment & Plan Note (Signed)
Changed losartan to valsartan 160 mg daily 01/26/2022   Although even in retrospect it may not be clear the ACEi contributed to the pt's symptoms,  Pt improved off them and adding them back at this point or in the future would risk confusion in interpretation of non-specific respiratory symptoms to which this patient is prone  ie  Better not to muddy the waters here.   >>> continue valsartan 160 mg daily, refill per [facility] doc's discretion

## 2022-03-07 NOTE — Progress Notes (Signed)
Ryan [name], male    DOB: 1965/06/12    MRN: [id number]   Brief patient profile:  56   yobm  active smoker/ [employer] limited by dm/neuropathy  referred to pulmonary clinic in [place]  01/26/2022 by [place] for cough > Sob x 10 years year round worse in heat      History of Present Illness  01/26/2022  Pulmonary/ 1st office eval/ Ryan [name] / [name]  Chief Complaint  Patient presents with   Consult    Consult for cough. 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  Flu shot today     Dyspnea:  sometimes when rolls over in bed/ sometimes with heavy lifting  Cough: when gets hot/ does fine flat with pillows > min clear  Sleep: no am resp cc  SABA use: never  Has HB on ppi but not ac   Rec Change prilosec (omeprazole)  40 mg Take 30- 60 min before your first and last meals of the day and take Pepcid (famotidine) 20 mg at bedtime GERD Diet  For cough > tessalon 200 mg every 4- 6 hours as needed  Stop losartan and start valsartan 160 mg one daily    Allergy screen 01/26/2022 >  Eos 0.5 /  IgE 46    03/07/2022  f/u ov/ office/Ryan [name] re: CB  maint on gerd rx  / still questions  Chief Complaint  Patient presents with   Follow-up    Cough a little better since last ov but not much improved    Dyspnea:  more limited by back than breathing Cough: sporadic  Sleeping: bed is flat/ evelated  SABA use: none  02: none  Covid status: not updated / never infected  [facility]   No obvious day to day or daytime variability or assoc excess/ purulent sputum or mucus plugs or hemoptysis or cp or chest tightness, subjective wheeze or overt sinus or hb symptoms.   sleeping without nocturnal  or early am exacerbation  of respiratory  c/o's or need for noct saba. Also denies any obvious fluctuation of symptoms with weather or environmental changes or other aggravating or alleviating factors except as outlined above   No unusual exposure hx or h/o childhood  pna/ asthma or knowledge of premature birth.  Current Allergies, Complete Past Medical History, Past Surgical History, Family History, and Social History were reviewed in [name] record.  ROS  The following are not active complaints unless bolded Hoarseness, sore throat, dysphagia, dental problems, itching, sneezing,  nasal congestion or discharge of excess mucus or purulent secretions, ear ache,   fever, chills, sweats, unintended wt loss or wt gain, classically pleuritic or exertional cp,  orthopnea pnd or arm/hand swelling  or leg swelling, presyncope, palpitations, abdominal pain, anorexia, nausea, vomiting, diarrhea  or change in bowel habits or change in bladder habits, change in stools or change in urine, dysuria, hematuria,  rash, arthralgias, visual complaints, headache, numbness, weakness or ataxia or problems with walking or coordination,  change in mood or  memory.        Current Meds  Medication Sig   amLODipine (NORVASC) 10 MG tablet Take 10 mg by mouth daily.   atorvastatin (LIPITOR) 40 MG tablet Take 40 mg by mouth at bedtime.   benzonatate (TESSALON) 200 MG capsule Take 1 capsule (200 mg total) by mouth 3 (three) times daily as needed for cough.   diclofenac Sodium (VOLTAREN) 1 % GEL Apply 2 g  topically 3 (three) times daily as needed (knee pain).   metoprolol tartrate (LOPRESSOR) 50 MG tablet Take 25-50 mg by mouth See admin instructions. Take 50 mg by mouth in the morning and  25 mg at night   omeprazole (PRILOSEC) 40 MG capsule Take 40 mg by mouth daily.   Polyethyl Glycol-Propyl Glycol (SYSTANE OP) Place 1 drop into both eyes 2 (two) times daily as needed (dry eyes).   valsartan (DIOVAN) 160 MG tablet Take 1 tablet (160 mg total) by mouth daily.             Past Medical History:  Diagnosis Date   Chronic back pain    Hypertension    PTSD (post-traumatic stress disorder)        Objective:    Wt Readings from Last 3 Encounters:  03/07/22  200 lb (90.7 kg)  01/26/22 196 lb (88.9 kg)  07/01/21 209 lb 14.1 oz (95.2 kg)    Vital signs reviewed  03/07/2022  - Note at rest 02 sats  99% on RA   General appearance:    mod obese amb bm nad   HEENT : Oropharynx  clear     Nasal turbinates nl    NECK :  without  apparent JVD/ palpable Nodes/TM    LUNGS: no acc muscle use,  Nl contour chest which is clear to A and P bilaterally without cough on insp or exp maneuvers   CV:  RRR  no s3 or murmur or increase in P2, and no edema   ABD:  soft and nontender with nl inspiratory excursion in the supine position. No bruits or organomegaly appreciated   MS:  Nl gait/ ext warm without deformities Or obvious joint restrictions  calf tenderness, cyanosis or clubbing    SKIN: warm and dry without lesions    NEURO:  alert, approp, nl sensorium with  no motor or cerebellar deficits apparent.          Assessment

## 2022-03-07 NOTE — Assessment & Plan Note (Signed)
Active smoker/ onset x around 2013 with overt HB - Allergy screen 01/26/2022 >  Eos 0.5 /  IgE 46   - 01/26/2022 rec max gerd rx and change losartan to valsartan and f/u 6 wks> improved  03/07/2022 so rec no change rx/ f/u in 3-4 m

## 2022-03-24 ENCOUNTER — Ambulatory Visit
Admission: EM | Admit: 2022-03-24 | Discharge: 2022-03-24 | Disposition: A | Discharge status: Home / Self Care | Payer: No Typology Code available for payment source

## 2022-03-24 DIAGNOSIS — K0889 Other specified disorders of teeth and supporting structures: Secondary | ICD-10-CM | POA: Diagnosis not present

## 2022-03-24 DIAGNOSIS — I10 Essential (primary) hypertension: Secondary | ICD-10-CM | POA: Diagnosis not present

## 2022-03-24 MED ORDER — AMOXICILLIN-POT CLAVULANATE 875-125 MG PO TABS: 1.0000 | ORAL_TABLET | Freq: Two times a day (BID) | ORAL | 0 refills | Status: AC

## 2022-03-24 NOTE — Discharge Instructions (Addendum)
I am concerned you may have a dental infection.  Start the Augmentin to treat the infection.  You can continue over-the-counter pain relievers as well as oral gel.  He can also apply ice to the outside of your face to help with pain and swelling inside your mouth.  Your blood pressure is a bit elevated today.  Please take your blood pressure when you get home.  Follow-up with your primary care provider if your blood pressure is higher than your normal.  If you develop chest pain, shortness of breath, vision changes, dizziness or lightheadedness, please go to the emergency room for further evaluation.

## 2022-03-24 NOTE — ED Provider Notes (Signed)
[facility]    CSN: [id number] Arrival date & time: 03/24/22  0830      History   Chief Complaint Chief Complaint  Patient presents with   Dental Pain    HPI Ryan [name] is a 56 y.o. male.   Patient presents today for right lower jaw pain that has been ongoing since last Wednesday.  Reports last week, he took a Goody's powder which fully relieve the pain.  The pain has come back this week and has not been fully relieved with NSAIDs.  Reports he has broken teeth in the back of his mouth and thinks he may have infection in the tooth.  Reports he is due for some dental work, however the [place] has not scheduled him with a dentist yet.  Reports a couple of years ago, he had dental work with a local provider and it was not completed and he is due for follow-up.  He denies fevers, nausea/vomiting, body aches or chills.  Has not felt any drainage inside his mouth.  No sore throat, cough or congestion.  Patient's blood pressure is also elevated today.  Reports he takes amlodipine, metoprolol, and Diovan and has taken his blood pressure medications this morning.  Reports when he checks his blood pressure at home, it is usually 130s to 150s over 90s.  Today, he denies chest pain, shortness of breath, vision changes, dizziness or lightheadedness, lower extremity swelling, and headache.    Past Medical History:  Diagnosis Date   Chronic back pain    Hypertension    PTSD (post-traumatic stress disorder)     Patient Active Problem List   Diagnosis Date Noted   Upper airway cough syndrome 01/26/2022   Chest pain 05/19/2016   Essential hypertension 05/19/2016   Cigarette smoker 05/19/2016    Past Surgical History:  Procedure Laterality Date   APPENDECTOMY     BIOPSY  07/01/2021   Procedure: BIOPSY;  Surgeon: Eloise Harman, DO;  [facility];  Service: Endoscopy;;   COLONOSCOPY WITH PROPOFOL N/A 07/01/2021   Procedure: COLONOSCOPY WITH PROPOFOL;  Surgeon:  Eloise Harman, DO;  Location: [facility];  Service: Endoscopy;  Laterality: N/A;  1:45pm, asa 2   ESOPHAGOGASTRODUODENOSCOPY (EGD) WITH PROPOFOL N/A 07/01/2021   Procedure: ESOPHAGOGASTRODUODENOSCOPY (EGD) WITH PROPOFOL;  Surgeon: Eloise Harman, DO;  Location: [facility];  Service: Endoscopy;  Laterality: N/A;   KNEE SURGERY Bilateral    arthroscopic   SHOULDER SURGERY         Home Medications    Prior to Admission medications   Medication Sig Start Date End Date Taking? Authorizing Provider  amLODipine (NORVASC) 10 MG tablet Take 10 mg by mouth daily.   Yes [provider]  amoxicillin-clavulanate (AUGMENTIN) 875-125 MG tablet Take 1 tablet by mouth 2 (two) times daily for 7 days. 03/24/22 03/31/22 Yes Eulogio Bear, NP  atorvastatin (LIPITOR) 40 MG tablet Take 40 mg by mouth at bedtime. 06/15/21  Yes [provider]  cyanocobalamin 100 MCG tablet Take 100 mcg by mouth daily.   Yes [provider]  diclofenac Sodium (VOLTAREN) 1 % GEL Apply 2 g topically 3 (three) times daily as needed (knee pain).   Yes [provider]  metoprolol tartrate (LOPRESSOR) 50 MG tablet Take 25-50 mg by mouth See admin instructions. Take 50 mg by mouth in the morning and  25 mg at night   Yes [provider]  omeprazole (PRILOSEC) 40 MG capsule Take  40 mg by mouth daily.   Yes [provider]  Polyethyl Glycol-Propyl Glycol (SYSTANE OP) Place 1 drop into both eyes 2 (two) times daily as needed (dry eyes).   Yes [provider]  valsartan (DIOVAN) 160 MG tablet Take 1 tablet (160 mg total) by mouth daily. 01/26/22  Yes Nyoka Cowden, MD    Family History Family History  Problem Relation Age of Onset   Heart attack Mother 66       Deceased    Heart attack Father 42       Decesed    CAD Sister 88       Stents/ICD    Social History Social History   Tobacco Use   Smoking status: Some Days    Packs/day: 0.50    Types:  Cigarettes    Last attempt to quit: 12/17/2014    Years since quitting: 7.2   Smokeless tobacco: Former    Quit date: 01/17/2016   Tobacco comments:    Smokes on occasion   Vaping Use   Vaping Use: Never used  Substance Use Topics   Alcohol use: No    Comment: occasional    Drug use: No     Allergies   Patient has no known allergies.   Review of Systems Review of Systems Per HPI  Physical Exam Triage Vital Signs ED Triage Vitals  Enc Vitals Group     BP 03/24/22 1012 (!) 171/109     Pulse Rate 03/24/22 1011 68     Resp 03/24/22 1011 18     Temp 03/24/22 1011 98.1 F (36.7 C)     Temp Source 03/24/22 1011 Oral     SpO2 03/24/22 1011 98 %     Weight --      Height --      Head Circumference --      Peak Flow --      Pain Score 03/24/22 1012 6     Pain Loc --      Pain Edu? --      Excl. in GC? --    No data found.  Updated Vital Signs BP (!) 171/109   Pulse 68   Temp 98.1 F (36.7 C) (Oral)   Resp 18   SpO2 98%   BP recheck: 172/102  Visual Acuity Right Eye Distance:   Left Eye Distance:   Bilateral Distance:    Right Eye Near:   Left Eye Near:    Bilateral Near:     Physical Exam Vitals and nursing note reviewed.  Constitutional:      General: He is not in acute distress.    Appearance: Normal appearance. He is not toxic-appearing.  HENT:     Head: Normocephalic and atraumatic.     Mouth/Throat:     Mouth: Mucous membranes are moist.     Dentition: Abnormal dentition. Dental tenderness and dental caries present. No gingival swelling, dental abscesses or gum lesions.     Pharynx: Oropharynx is clear.      Comments: Gums tender around tooth marked; no obvious abscess, dental swelling, or fluctuant areas Cardiovascular:     Rate and Rhythm: Normal rate.  Pulmonary:     Effort: Pulmonary effort is normal. No respiratory distress.  Lymphadenopathy:     Cervical: No cervical adenopathy.  Skin:    General: Skin is warm.     Coloration: Skin  is not jaundiced or pale.     Findings: No erythema.  Neurological:  Mental Status: He is alert and oriented to person, place, and time.  Psychiatric:        Behavior: Behavior is cooperative.      UC Treatments / Results  Labs (all labs ordered are listed, but only abnormal results are displayed) Labs Reviewed - No data to display  EKG   Radiology No results found.  Procedures Procedures (including critical care time)  Medications Ordered in UC Medications - No data to display  Initial Impression / Assessment and Plan / UC Course  I have reviewed the triage vital signs and the nursing notes.  Pertinent labs & imaging results that were available during my care of the patient were reviewed by me and considered in my medical decision making (see chart for details).   Patient is well-appearing, afebrile, not tachycardic, not tachypneic, oxygenating well on room air.  He is hypertensive today, however not having any symptoms.  Dentalgia Treat with Augmentin twice daily for 7 days Continue supportive care Recommended close follow-up with dentist and dental resource given ER and return precautions discussed  Essential hypertension Blood pressure recheck remains elevated Recommended monitoring at home and notifying primary care provider if blood pressure remains greater than goal Strict ER precautions discussed with patient regarding blood pressure symptoms  The patient was given the opportunity to ask questions.  All questions answered to their satisfaction.  The patient is in agreement to this plan.    Final Clinical Impressions(s) / UC Diagnoses   Final diagnoses:  Dentalgia  Essential hypertension     Discharge Instructions      I am concerned you may have a dental infection.  Start the Augmentin to treat the infection.  You can continue over-the-counter pain relievers as well as oral gel.  He can also apply ice to the outside of your face to help with pain  and swelling inside your mouth.  Your blood pressure is a bit elevated today.  Please take your blood pressure when you get home.  Follow-up with your primary care provider if your blood pressure is higher than your normal.  If you develop chest pain, shortness of breath, vision changes, dizziness or lightheadedness, please go to the emergency room for further evaluation.    ED Prescriptions     Medication Sig Dispense Auth. Provider   amoxicillin-clavulanate (AUGMENTIN) 875-125 MG tablet Take 1 tablet by mouth 2 (two) times daily for 7 days. 14 tablet Eulogio Bear, NP      PDMP not reviewed this encounter.   Eulogio Bear, NP 03/24/22 1049

## 2022-03-24 NOTE — ED Triage Notes (Signed)
Pain in right lower teeth, that goes up to his ear started last Wednesday. Taking Ibuprofen, oragel to help with pain.

## 2022-05-02 ENCOUNTER — Ambulatory Visit (HOSPITAL_COMMUNITY)
Admission: RE | Admit: 2022-05-02 | Discharge: 2022-05-02 | Disposition: A | Discharge status: Home / Self Care | Payer: No Typology Code available for payment source | Source: Ambulatory Visit | Attending: Internal Medicine | Admitting: Internal Medicine

## 2022-05-02 DIAGNOSIS — R058 Other specified cough: Secondary | ICD-10-CM | POA: Insufficient documentation

## 2022-05-02 LAB — PULMONARY FUNCTION TEST
DL/VA % pred: 83 %
DL/VA: 3.66 ml/min/mmHg/L
DLCO unc % pred: 56 %
DLCO unc: 14.22 ml/min/mmHg
FEF 25-75 Post: 2.03 L/sec
FEF 25-75 Pre: 2.01 L/sec
FEF2575-%Change-Post: 0 %
FEF2575-%Pred-Post: 72 %
FEF2575-%Pred-Pre: 71 %
FEV1-%Change-Post: 2 %
FEV1-%Pred-Post: 71 %
FEV1-%Pred-Pre: 70 %
FEV1-Post: 2.33 L
FEV1-Pre: 2.28 L
FEV1FVC-%Change-Post: 5 %
FEV1FVC-%Pred-Pre: 100 %
FEV6-%Change-Post: -3 %
FEV6-%Pred-Post: 70 %
FEV6-%Pred-Pre: 73 %
FEV6-Post: 2.87 L
FEV6-Pre: 2.96 L
FEV6FVC-%Pred-Post: 104 %
FEV6FVC-%Pred-Pre: 104 %
FVC-%Change-Post: -3 %
FVC-%Pred-Post: 67 %
FVC-%Pred-Pre: 69 %
FVC-Post: 2.87 L
FVC-Pre: 2.96 L
Post FEV1/FVC ratio: 81 %
Post FEV6/FVC ratio: 100 %
Pre FEV1/FVC ratio: 77 %
Pre FEV6/FVC Ratio: 100 %
RV % pred: 112 %
RV: 2.18 L
TLC % pred: 84 %
TLC: 5.21 L

## 2022-05-02 MED ORDER — ALBUTEROL SULFATE (2.5 MG/3ML) 0.083% IN NEBU
2.5000 mg | INHALATION_SOLUTION | Freq: Once | RESPIRATORY_TRACT | Status: AC
  Administered 2022-05-02: 2.5 mg via RESPIRATORY_TRACT

## 2022-05-28 NOTE — Progress Notes (Unsigned)
Ryan [name], male    DOB: [birth year]    MRN: [id number]   Brief patient profile:  31  yobm active smoker/ [employer] limited by dm/neuropathy  referred to pulmonary clinic in [place]  01/26/2022 by [place] for cough > Sob x 10 years year round worse in heat      History of Present Illness  01/26/2022  Pulmonary/ 1st office eval/ Winfrey Chillemi / [facility]  Chief Complaint  Patient presents with   Consult    Consult for cough. 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  Flu shot today     Dyspnea:  sometimes when rolls over in bed/ sometimes with heavy lifting  Cough: when gets hot/ does fine flat with pillows > min clear  Sleep: no am resp cc  SABA use: never  Has HB on ppi but not ac   Rec Change prilosec (omeprazole)  40 mg Take 30- 60 min before your first and last meals of the day and take Pepcid (famotidine) 20 mg at bedtime GERD Diet  For cough > tessalon 200 mg every 4- 6 hours as needed  Stop losartan and start valsartan 160 mg one daily    Allergy screen 01/26/2022 >  Eos 0.5 /  IgE 46    03/07/2022  f/u ov/[place] office/Taniya Dasher re: CB  maint on gerd rx   Chief Complaint  Patient presents with   Follow-up    Cough a little better since last ov but not much improved    Dyspnea:  more limited by back than breathing Cough: sporadic  Sleeping: bed is flat/ evelated  SABA use: none  02: none  Covid status: not updated / never infected  [facility]  [facility] with your primary at [facility] lung cancer screening = annual low dose CT We will go ahead and schedule PFTS at [facility] If any trouble with night time or early am cough > bed blocks 6-8 in The key is to stop smoking completely before smoking completely stops you! Please schedule a follow up visit in 3 - 4 months but call sooner if needed    05/29/2022  f/u ov/[place] office/Emmilynn Marut re: doe / still smoking maint on no resp rx   Chief Complaint  Patient presents with   Follow-up     Cough has improved since last ov   Dyspnea:  really not limiting  Cough: minimal am smoker's rattle  Sleeping: no resp cc  SABA use: none  02: none  [facility]   No obvious day to day or daytime variability or assoc excess/ purulent sputum or mucus plugs or hemoptysis or cp or chest tightness, subjective wheeze or overt sinus or hb symptoms.   sleeping without nocturnal  or early am exacerbation  of respiratory  c/o's or need for noct saba. Also denies any obvious fluctuation of symptoms with weather or environmental changes or other aggravating or alleviating factors except as outlined above   No unusual exposure hx or h/o childhood pna/ asthma or knowledge of premature birth.  Current Allergies, Complete Past Medical History, Past Surgical History, Family History, and Social History were reviewed in [employer] record.  ROS  The following are not active complaints unless bolded Hoarseness, sore throat, dysphagia, dental problems, itching, sneezing,  nasal congestion or discharge of excess mucus or purulent secretions, ear ache,   fever, chills, sweats, unintended wt loss or wt gain, classically pleuritic or exertional cp,  orthopnea  pnd or arm/hand swelling  or leg swelling, presyncope, palpitations, abdominal pain, anorexia, nausea, vomiting, diarrhea  or change in bowel habits or change in bladder habits, change in stools or change in urine, dysuria, hematuria,  rash, arthralgias, visual complaints, headache, numbness, weakness or ataxia or problems with walking or coordination,  change in mood or  memory.        Current Meds  Medication Sig   amLODipine (NORVASC) 10 MG tablet Take 10 mg by mouth daily.   atorvastatin (LIPITOR) 40 MG tablet Take 40 mg by mouth at bedtime.   cyanocobalamin 100 MCG tablet Take 100 mcg by mouth daily.   diclofenac Sodium (VOLTAREN) 1 % GEL Apply 2 g topically 3 (three) times daily as needed (knee pain).    metoprolol tartrate (LOPRESSOR) 50 MG tablet Take 25-50 mg by mouth See admin instructions. Take 50 mg by mouth in the morning and  25 mg at night   omeprazole (PRILOSEC) 40 MG capsule Take 40 mg by mouth daily.   Polyethyl Glycol-Propyl Glycol (SYSTANE OP) Place 1 drop into both eyes 2 (two) times daily as needed (dry eyes).   valsartan (DIOVAN) 160 MG tablet Take 1 tablet (160 mg total) by mouth daily.             Past Medical History:  Diagnosis Date   Chronic back pain    Hypertension    PTSD (post-traumatic stress disorder)        Objective:    wts  05/29/2022       201   03/07/22 200 lb (90.7 kg)  01/26/22 196 lb (88.9 kg)  07/01/21 209 lb 14.1 oz (95.2 kg)    Vital signs reviewed  05/29/2022  - Note at rest 02 sats  97% on RA   General appearance:    amb somber bm nad   HEENT : Oropharynx  clear       NECK :  without  apparent JVD/ palpable Nodes/TM    LUNGS: no acc muscle use,  Nl contour chest which is clear to A and P bilaterally without cough on insp or exp maneuvers   CV:  RRR  no s3 or murmur or increase in P2, and no edema   ABD:  mod obese soft and nontender with nl inspiratory excursion in the supine position. No bruits or organomegaly appreciated   MS:  Nl gait/ ext warm without deformities Or obvious joint restrictions  calf tenderness, cyanosis or clubbing    SKIN: warm and dry without lesions    NEURO:  alert, approp, nl sensorium with  no motor or cerebellar deficits apparent.            Assessment

## 2022-05-29 ENCOUNTER — Ambulatory Visit (INDEPENDENT_AMBULATORY_CARE_PROVIDER_SITE_OTHER): Payer: No Typology Code available for payment source | Admitting: Internal Medicine

## 2022-05-29 ENCOUNTER — Encounter: Payer: Self-pay | Admitting: Internal Medicine

## 2022-05-29 VITALS — BP 138/92 | HR 82 | Ht 66.0 in | Wt 201.6 lb

## 2022-05-29 DIAGNOSIS — F17210 Nicotine dependence, cigarettes, uncomplicated: Secondary | ICD-10-CM

## 2022-05-29 DIAGNOSIS — I10 Essential (primary) hypertension: Secondary | ICD-10-CM | POA: Diagnosis not present

## 2022-05-29 DIAGNOSIS — R058 Other specified cough: Secondary | ICD-10-CM | POA: Diagnosis not present

## 2022-05-29 NOTE — Assessment & Plan Note (Signed)
4-5 min discussion re active cigarette smoking in addition to office E&M  Ask about tobacco use:   ongoing Advise quitting   I took an extended  opportunity with this patient to outline the consequences of continued cigarette use  in airway disorders based on all the data we have from the multiple national lung health studies (perfomed over decades at millions of dollars in cost)  indicating that smoking cessation, not choice of inhalers or physicians, is the most important aspect of his care, esp since he does not yet meet criteria for copd    Assess willingness:  Not committed at this point Assist in quit attempt:  Per PCP when ready Arrange follow up:   Follow up per Primary Care planned  For smoking cessation classes call [phone]

## 2022-05-29 NOTE — Patient Instructions (Signed)
Discuss with your [facility] doctor entering the lung cancer screening program   Weight control is simply a matter of calorie balance which needs to be tilted in your favor by eating less and exercising more.  To get the most out of exercise, you need to be continuously aware that you are short of breath, but never out of breath, for 30 minutes daily. As you improve, it will actually be easier for you to do the same amount of exercise  in  30 minutes so always push to the level where you are short of breath.    Pulmonary follow up is as needed

## 2022-05-29 NOTE — Assessment & Plan Note (Signed)
Changed losartan to valsartan 160 mg daily 01/26/2022   Although even in retrospect it may not be clear the losartan  contributed to the pt's symptoms,   adding them back at this point or in the future would risk confusion in interpretation of non-specific respiratory symptoms to which this patient is prone  ie  Better not to muddy the waters here.   >>> continue valsartan '160mg'$  daily with f/u by [facility]

## 2022-05-29 NOTE — Assessment & Plan Note (Addendum)
Active smoker/ onset x around 2013 with overt HB - Allergy screen 01/26/2022 >  Eos 0.5 /  IgE 46   - 01/26/2022 rec max gerd rx and change losartan to valsartan and f/u 6 wks> improved  03/07/2022 so rec no change rx/ f/u in 3-4 m - PFT's   05/02/22  FEV1 2.33 (71 % ) ratio 0.81  p 2 % improvement from saba p 0 prior to study with DLCO  14.22 (56%)   and FV curve nl and ERV 19% at wt 205    Main abnormality is disproportionately low ERV c/w body habitus/ advised on reconditioning/ wt loss   No evidence of significant asthma or copd at this point/ pulmonary f/u is prn

## 2022-06-21 ENCOUNTER — Other Ambulatory Visit: Payer: PRIVATE HEALTH INSURANCE

## 2022-06-21 ENCOUNTER — Other Ambulatory Visit: Payer: Self-pay | Admitting: Cardiovascular Disease

## 2022-06-21 DIAGNOSIS — Z0000 Encounter for general adult medical examination without abnormal findings: Secondary | ICD-10-CM

## 2023-11-09 ENCOUNTER — Encounter: Payer: Self-pay | Admitting: Emergency Medicine

## 2023-11-09 ENCOUNTER — Ambulatory Visit: Admission: EM | Admit: 2023-11-09 | Discharge: 2023-11-09 | Disposition: A | Discharge status: Home / Self Care

## 2023-11-09 DIAGNOSIS — L089 Local infection of the skin and subcutaneous tissue, unspecified: Secondary | ICD-10-CM | POA: Diagnosis not present

## 2023-11-09 DIAGNOSIS — T148XXA Other injury of unspecified body region, initial encounter: Secondary | ICD-10-CM | POA: Diagnosis not present

## 2023-11-09 HISTORY — DX: Type 2 diabetes mellitus without complications: E11.9

## 2023-11-09 MED ORDER — CEPHALEXIN 500 MG PO CAPS: 500.0000 mg | ORAL_CAPSULE | Freq: Two times a day (BID) | ORAL | 0 refills | Status: AC

## 2023-11-09 MED ORDER — MUPIROCIN 2 % EX OINT: 1.0000 | TOPICAL_OINTMENT | Freq: Two times a day (BID) | CUTANEOUS | 0 refills | Status: AC

## 2023-11-09 MED ORDER — CHLORHEXIDINE GLUCONATE 4 % EX SOLN
Freq: Every day | CUTANEOUS | 0 refills | Status: AC | PRN
Start: 2023-11-09 — End: ?

## 2023-11-09 NOTE — Discharge Instructions (Addendum)
 Clean the area about twice a day with the Hibiclens  solution and apply the mupirocin  ointment and a nonstick dressing.  Take the full course of antibiotics.  Elevate at rest to help with swelling.  Follow-up for worsening or unresolving symptoms

## 2023-11-09 NOTE — ED Triage Notes (Signed)
 Blister on lateral side left foot x 1 week

## 2023-11-09 NOTE — ED Provider Notes (Signed)
 [facility]    CSN: [id number] Arrival date & time: 11/09/23  1331      History   Chief Complaint No chief complaint on file.   HPI Ryan [name] is a 58 y.o. male.   Patient presenting today with 1 week history of a painful blister to the sole of his left foot.  Denies known injury to the area, fever, chills, numbness, tingling, decreased range of motion.  States has had these blisters in the past.  Concerned about infection due to type II DM diabetes.  Trying to keep the area clean, no improvement.    Past Medical History:  Diagnosis Date   Chronic back pain    Diabetes mellitus without complication (HCC)    Hypertension    PTSD (post-traumatic stress disorder)     Patient Active Problem List   Diagnosis Date Noted   Upper airway cough syndrome 01/26/2022   Chest pain 05/19/2016   Essential hypertension 05/19/2016   Cigarette smoker 05/19/2016    Past Surgical History:  Procedure Laterality Date   APPENDECTOMY     BIOPSY  07/01/2021   Procedure: BIOPSY;  Surgeon: Cindie Carlin POUR, DO;  Location: [facility];  Service: Endoscopy;;   COLONOSCOPY WITH PROPOFOL  N/A 07/01/2021   Procedure: COLONOSCOPY WITH PROPOFOL ;  Surgeon: Cindie Carlin POUR, DO;  Location: [facility];  Service: Endoscopy;  Laterality: N/A;  1:45pm, asa 2   ESOPHAGOGASTRODUODENOSCOPY (EGD) WITH PROPOFOL  N/A 07/01/2021   Procedure: ESOPHAGOGASTRODUODENOSCOPY (EGD) WITH PROPOFOL ;  Surgeon: Cindie Carlin POUR, DO;  Location: [facility];  Service: Endoscopy;  Laterality: N/A;   KNEE SURGERY Bilateral    arthroscopic   SHOULDER SURGERY         Home Medications    Prior to Admission medications   Medication Sig Start Date End Date Taking? Authorizing Provider  cephALEXin  (KEFLEX ) 500 MG capsule Take 1 capsule (500 mg total) by mouth 2 (two) times daily. 11/09/23  Yes Stuart Vernell Norris, PA-C  chlorhexidine  (HIBICLENS ) 4 % external liquid Apply topically daily as needed. 11/09/23   Yes Stuart Vernell Norris, PA-C  metformin (FORTAMET) 500 MG (OSM) 24 hr tablet Take 500 mg by mouth daily with breakfast.   Yes [provider]  mupirocin  ointment (BACTROBAN ) 2 % Apply 1 Application topically 2 (two) times daily. 11/09/23  Yes Stuart Vernell Norris, PA-C  amLODipine  (NORVASC ) 10 MG tablet Take 10 mg by mouth daily.    [provider]  atorvastatin (LIPITOR) 40 MG tablet Take 40 mg by mouth at bedtime. 06/15/21   [provider]  cyanocobalamin 100 MCG tablet Take 100 mcg by mouth daily.    [provider]  diclofenac Sodium (VOLTAREN) 1 % GEL Apply 2 g topically 3 (three) times daily as needed (knee pain).    [provider]  metoprolol  tartrate (LOPRESSOR ) 50 MG tablet Take 25-50 mg by mouth See admin instructions. Take 50 mg by mouth in the morning and  25 mg at night    [provider]  omeprazole (PRILOSEC) 40 MG capsule Take 40 mg by mouth daily.    [provider]  Polyethyl Glycol-Propyl Glycol (SYSTANE OP) Place 1 drop into both eyes 2 (two) times daily as needed (dry eyes).    [provider]  valsartan  (DIOVAN ) 160 MG tablet Take 1 tablet (160 mg total) by mouth daily. 01/26/22   Darlean Ozell NOVAK, MD    Family History Family History  Problem Relation Age of Onset  Heart attack Mother 40       Deceased    Heart attack Father 67       Decesed    CAD Sister 40       Stents/ICD    Social History Social History   Tobacco Use   Smoking status: Some Days    Current packs/day: 0.00    Types: Cigarettes    Last attempt to quit: 12/17/2014    Years since quitting: 8.9   Smokeless tobacco: Former    Quit date: 01/17/2016   Tobacco comments:    Smokes on occasion   Vaping Use   Vaping status: Never Used  Substance Use Topics   Alcohol use: No    Comment: occasional    Drug use: No     Allergies   Patient has no known allergies.   Review of Systems Review of Systems PER  HPI  Physical Exam Triage Vital Signs ED Triage Vitals  Encounter Vitals Group     BP 11/09/23 1439 (!) 151/87     Girls Systolic BP Percentile --      Girls Diastolic BP Percentile --      Boys Systolic BP Percentile --      Boys Diastolic BP Percentile --      Pulse Rate 11/09/23 1439 70     Resp 11/09/23 1439 20     Temp 11/09/23 1439 98.5 F (36.9 C)     Temp Source 11/09/23 1439 Oral     SpO2 11/09/23 1439 94 %     Weight --      Height --      Head Circumference --      Peak Flow --      Pain Score 11/09/23 1440 2     Pain Loc --      Pain Education --      Exclude from Growth Chart --    No data found.  Updated Vital Signs BP (!) 151/87 (BP Location: Right Arm)   Pulse 70   Temp 98.5 F (36.9 C) (Oral)   Resp 20   SpO2 94%   Visual Acuity Right Eye Distance:   Left Eye Distance:   Bilateral Distance:    Right Eye Near:   Left Eye Near:    Bilateral Near:     Physical Exam Vitals and nursing note reviewed.  Constitutional:      Appearance: Normal appearance.  HENT:     Head: Atraumatic.     Mouth/Throat:     Mouth: Mucous membranes are moist.  Eyes:     Extraocular Movements: Extraocular movements intact.     Conjunctiva/sclera: Conjunctivae normal.  Cardiovascular:     Rate and Rhythm: Normal rate.  Pulmonary:     Effort: Pulmonary effort is normal.  Musculoskeletal:        General: Normal range of motion.     Cervical back: Normal range of motion and neck supple.  Skin:    General: Skin is warm.     Comments: 1 to 1.5 cm oval-shaped blister with purulent drainage within to the sole of the left foot.  Tender to palpation, mild surrounding erythema  Neurological:     Mental Status: He is oriented to person, place, and time.     Comments: Left lower extremity neurovascularly intact  Psychiatric:        Mood and Affect: Mood normal.        Thought Content: Thought content normal.  Judgment: Judgment normal.      UC Treatments /  Results  Labs (all labs ordered are listed, but only abnormal results are displayed) Labs Reviewed - No data to display  EKG   Radiology No results found.  Procedures Procedures (including critical care time)  Medications Ordered in UC Medications - No data to display  Initial Impression / Assessment and Plan / UC Course  I have reviewed the triage vital signs and the nursing notes.  Pertinent labs & imaging results that were available during my care of the patient were reviewed by me and considered in my medical decision making (see chart for details).     Area cleaned with Hibiclens , cold spray applied and 18-gauge needle utilized to drain slightly purulent then drainage.  Patient tolerated this well, dressing was applied and will treat with Keflex , Bactroban , Epsom salt soaks and elevation.  Return for worsening symptoms.  Final Clinical Impressions(s) / UC Diagnoses   Final diagnoses:  Infected blister     Discharge Instructions      Clean the area about twice a day with the Hibiclens  solution and apply the mupirocin  ointment and a nonstick dressing.  Take the full course of antibiotics.  Elevate at rest to help with swelling.  Follow-up for worsening or unresolving symptoms    ED Prescriptions     Medication Sig Dispense Auth. Provider   cephALEXin  (KEFLEX ) 500 MG capsule Take 1 capsule (500 mg total) by mouth 2 (two) times daily. 14 capsule Stuart Vernell Norris, PA-C   mupirocin  ointment (BACTROBAN ) 2 % Apply 1 Application topically 2 (two) times daily. 60 g Stuart Vernell Norris, PA-C   chlorhexidine  (HIBICLENS ) 4 % external liquid Apply topically daily as needed. 236 mL Stuart Vernell Norris, [place]      PDMP not reviewed this encounter.   Stuart Vernell Norris, [place] 11/09/23 [phone]

## 2024-03-25 ENCOUNTER — Emergency Department (HOSPITAL_COMMUNITY)
Admission: EM | Admit: 2024-03-25 | Discharge: 2024-03-25 | Disposition: A | Discharge status: Home / Self Care | Attending: Emergency Medicine | Admitting: Emergency Medicine

## 2024-03-25 ENCOUNTER — Emergency Department (HOSPITAL_COMMUNITY)

## 2024-03-25 ENCOUNTER — Encounter (HOSPITAL_COMMUNITY): Payer: Self-pay

## 2024-03-25 ENCOUNTER — Other Ambulatory Visit: Payer: Self-pay

## 2024-03-25 DIAGNOSIS — Z7984 Long term (current) use of oral hypoglycemic drugs: Secondary | ICD-10-CM | POA: Diagnosis not present

## 2024-03-25 DIAGNOSIS — R9431 Abnormal electrocardiogram [ECG] [EKG]: Secondary | ICD-10-CM | POA: Diagnosis not present

## 2024-03-25 DIAGNOSIS — Z79899 Other long term (current) drug therapy: Secondary | ICD-10-CM | POA: Insufficient documentation

## 2024-03-25 DIAGNOSIS — I10 Essential (primary) hypertension: Secondary | ICD-10-CM | POA: Insufficient documentation

## 2024-03-25 DIAGNOSIS — R252 Cramp and spasm: Secondary | ICD-10-CM | POA: Insufficient documentation

## 2024-03-25 DIAGNOSIS — E119 Type 2 diabetes mellitus without complications: Secondary | ICD-10-CM | POA: Insufficient documentation

## 2024-03-25 LAB — URINALYSIS, ROUTINE W REFLEX MICROSCOPIC
Bilirubin Urine: NEGATIVE
Glucose, UA: NEGATIVE mg/dL
Hgb urine dipstick: NEGATIVE
Ketones, ur: NEGATIVE mg/dL
Leukocytes,Ua: NEGATIVE
Nitrite: NEGATIVE
Protein, ur: NEGATIVE mg/dL
Specific Gravity, Urine: 1.006 (ref 1.005–1.030)
pH: 6 (ref 5.0–8.0)

## 2024-03-25 LAB — COMPREHENSIVE METABOLIC PANEL WITH GFR
ALT: 28 U/L (ref 0–44)
AST: 24 U/L (ref 15–41)
Albumin: 4 g/dL (ref 3.5–5.0)
Alkaline Phosphatase: 83 U/L (ref 38–126)
Anion gap: 16 — ABNORMAL HIGH (ref 5–15)
BUN: 18 mg/dL (ref 6–20)
CO2: 18 mmol/L — ABNORMAL LOW (ref 22–32)
Calcium: 9.3 mg/dL (ref 8.9–10.3)
Chloride: 99 mmol/L (ref 98–111)
Creatinine, Ser: 0.94 mg/dL (ref 0.61–1.24)
GFR, Estimated: >60 mL/min
Glucose, Bld: 103 mg/dL — ABNORMAL HIGH (ref 70–99)
Potassium: 4.1 mmol/L (ref 3.5–5.1)
Sodium: 133 mmol/L — ABNORMAL LOW (ref 135–145)
Total Bilirubin: 0.4 mg/dL (ref 0.0–1.2)
Total Protein: 7.3 g/dL (ref 6.5–8.1)

## 2024-03-25 LAB — CBC WITH DIFFERENTIAL/PLATELET
Abs Immature Granulocytes: 0.1 K/uL — ABNORMAL HIGH (ref 0.00–0.07)
Basophils Absolute: 0.1 K/uL (ref 0.0–0.1)
Basophils Relative: 1 %
Eosinophils Absolute: 0.2 K/uL (ref 0.0–0.5)
Eosinophils Relative: 2 %
HCT: 44.3 % (ref 39.0–52.0)
Hemoglobin: 15.5 g/dL (ref 13.0–17.0)
Immature Granulocytes: 1 %
Lymphocytes Relative: 30 %
Lymphs Abs: 4.5 K/uL — ABNORMAL HIGH (ref 0.7–4.0)
MCH: 31.7 pg (ref 26.0–34.0)
MCHC: 35 g/dL (ref 30.0–36.0)
MCV: 90.6 fL (ref 80.0–100.0)
Monocytes Absolute: 0.9 K/uL (ref 0.1–1.0)
Monocytes Relative: 6 %
Neutro Abs: 8.9 K/uL — ABNORMAL HIGH (ref 1.7–7.7)
Neutrophils Relative %: 60 %
Platelets: 325 K/uL (ref 150–400)
RBC: 4.89 MIL/uL (ref 4.22–5.81)
RDW: 13.6 % (ref 11.5–15.5)
WBC: 14.8 K/uL — ABNORMAL HIGH (ref 4.0–10.5)
nRBC: 0 % (ref 0.0–0.2)

## 2024-03-25 LAB — LIPASE, BLOOD: Lipase: 32 U/L (ref 11–51)

## 2024-03-25 LAB — RESP PANEL BY RT-PCR (RSV, FLU A&B, COVID)  RVPGX2
Influenza A by PCR: NEGATIVE
Influenza B by PCR: NEGATIVE
Resp Syncytial Virus by PCR: NEGATIVE
SARS Coronavirus 2 by RT PCR: NEGATIVE

## 2024-03-25 LAB — TROPONIN T, HIGH SENSITIVITY: Troponin T High Sensitivity: <15 ng/L (ref 0–19)

## 2024-03-25 LAB — MAGNESIUM: Magnesium: 1.9 mg/dL (ref 1.7–2.4)

## 2024-03-25 MED ORDER — METHOCARBAMOL 500 MG PO TABS
500.0000 mg | ORAL_TABLET | Freq: Once | ORAL | Status: AC
  Administered 2024-03-25: 500 mg via ORAL
  Filled 2024-03-25: qty 1

## 2024-03-25 MED ORDER — SODIUM CHLORIDE 0.9 % IV BOLUS
1000.0000 mL | Freq: Once | INTRAVENOUS | Status: AC
  Administered 2024-03-25: 1000 mL via INTRAVENOUS

## 2024-03-25 MED ORDER — METHOCARBAMOL 500 MG PO TABS: 500.0000 mg | ORAL_TABLET | Freq: Two times a day (BID) | ORAL | 0 refills | Status: AC

## 2024-03-25 NOTE — ED Triage Notes (Signed)
 Pt to ED with c/o muscle cramps (generalized) to different parts of body, right now located in lower right back, yesterday pt says he has had acid reflux since yesterday that resolved then came back today, pt took 2 more Pepcid today and now that is better

## 2024-03-25 NOTE — ED Provider Notes (Signed)
 " [place] EM[facility] AT [facility] Provider Note   CSN: [id number] Arrival date & time: 03/25/24  1901     Patient presents with: No chief complaint on file.   Ryan [name] is a 58 y.o. male presents with generalized muscle cramps since yesterday.  Patient states that he had a URI last week that probably resolved but he still felt dehydrated.  Has not been vomiting or have any diarrhea.  He has had decreased p.o. intake because of his acid reflux.  He states he will have these periods of severe cramps that will resolve moments later.  Currently he is having 1 in his chest.  Denies any shortness of breath.  Reports that he has had abnormal EKG findings in the past but is without any known cardiovascular disease.   HPI    Past Medical History:  Diagnosis Date   Chronic back pain    Diabetes mellitus without complication (HCC)    Hypertension    PTSD (post-traumatic stress disorder)    Past Surgical History:  Procedure Laterality Date   APPENDECTOMY     BIOPSY  07/01/2021   Procedure: BIOPSY;  Surgeon: Cindie Carlin POUR, DO;  [facility];  Service: Endoscopy;;   COLONOSCOPY WITH PROPOFOL  N/A 07/01/2021   Procedure: COLONOSCOPY WITH PROPOFOL ;  Surgeon: Cindie Carlin POUR, DO;  Location: [facility];  Service: Endoscopy;  Laterality: N/A;  1:45pm, asa 2   ESOPHAGOGASTRODUODENOSCOPY (EGD) WITH PROPOFOL  N/A 07/01/2021   Procedure: ESOPHAGOGASTRODUODENOSCOPY (EGD) WITH PROPOFOL ;  Surgeon: Cindie Carlin POUR, DO;  Location: [facility];  Service: Endoscopy;  Laterality: N/A;   KNEE SURGERY Bilateral    arthroscopic   SHOULDER SURGERY       Prior to Admission medications  Medication Sig Start Date End Date Taking? Authorizing Provider  methocarbamol  (ROBAXIN ) 500 MG tablet Take 1 tablet (500 mg total) by mouth 2 (two) times daily. 03/25/24  Yes Donnajean Lynwood DEL, PA-C  amLODipine  (NORVASC ) 10 MG tablet Take 10 mg by mouth daily.    [provider]  atorvastatin (LIPITOR) 40 MG tablet Take 40 mg by mouth at bedtime. 06/15/21   [provider]  cephALEXin  (KEFLEX ) 500 MG capsule Take 1 capsule (500 mg total) by mouth 2 (two) times daily. 11/09/23   Stuart Vernell Norris, PA-C  chlorhexidine  (HIBICLENS ) 4 % external liquid Apply topically daily as needed. 11/09/23   Stuart Vernell Norris, PA-C  cyanocobalamin 100 MCG tablet Take 100 mcg by mouth daily.    [provider]  diclofenac Sodium (VOLTAREN) 1 % GEL Apply 2 g topically 3 (three) times daily as needed (knee pain).    [provider]  metformin (FORTAMET) 500 MG (OSM) 24 hr tablet Take 500 mg by mouth daily with breakfast.    [provider]  metoprolol  tartrate (LOPRESSOR ) 50 MG tablet Take 25-50 mg by mouth See admin instructions. Take 50 mg by mouth in the morning and  25 mg at night    [provider]  mupirocin  ointment (BACTROBAN ) 2 % Apply 1 Application topically 2 (two) times daily. 11/09/23   Stuart Vernell Norris, PA-C  omeprazole (PRILOSEC) 40 MG capsule Take 40 mg by mouth daily.    [provider]  Polyethyl Glycol-Propyl Glycol (SYSTANE OP) Place 1 drop into both eyes 2 (two) times daily as needed (dry eyes).    [provider]  valsartan  (DIOVAN ) 160 MG tablet Take 1 tablet (160 mg total) by mouth daily.  01/26/22   Darlean Ozell NOVAK, MD    Allergies: Patient has no known allergies.    Review of Systems  Musculoskeletal:  Positive for myalgias.    Updated Vital Signs BP (!) 159/84   Pulse 66   Temp 98.4 F (36.9 C) (Oral)   Resp 15   Ht 5' 6 (1.676 m)   Wt 91.4 kg   SpO2 97%   BMI 32.52 kg/m   Physical Exam Vitals and nursing note reviewed.  Constitutional:      General: He is not in acute distress.    Appearance: He is well-developed.  HENT:     Head: Normocephalic and atraumatic.  Eyes:     Conjunctiva/sclera: Conjunctivae normal.  Cardiovascular:     Rate and Rhythm: Normal  rate and regular rhythm.     Heart sounds: No murmur heard. Pulmonary:     Effort: Pulmonary effort is normal. No respiratory distress.     Breath sounds: Normal breath sounds.  Abdominal:     Palpations: Abdomen is soft.     Tenderness: There is no abdominal tenderness.  Musculoskeletal:        General: No swelling.     Cervical back: Neck supple.  Skin:    General: Skin is warm and dry.     Capillary Refill: Capillary refill takes less than 2 seconds.  Neurological:     Mental Status: He is alert.  Psychiatric:        Mood and Affect: Mood normal.     (all labs ordered are listed, but only abnormal results are displayed) Labs Reviewed  CBC WITH DIFFERENTIAL/PLATELET - Abnormal; Notable for the following components:      Result Value   WBC 14.8 (*)    Neutro Abs 8.9 (*)    Lymphs Abs 4.5 (*)    Abs Immature Granulocytes 0.10 (*)    All other components within normal limits  COMPREHENSIVE METABOLIC PANEL WITH GFR - Abnormal; Notable for the following components:   Sodium 133 (*)    CO2 18 (*)    Glucose, Bld 103 (*)    Anion gap 16 (*)    All other components within normal limits  URINALYSIS, ROUTINE W REFLEX MICROSCOPIC - Abnormal; Notable for the following components:   Color, Urine STRAW (*)    All other components within normal limits  RESP PANEL BY RT-PCR (RSV, FLU A&B, COVID)  RVPGX2  LIPASE, BLOOD  MAGNESIUM  TROPONIN T, HIGH SENSITIVITY    EKG: None  Radiology: DG Chest Portable 1 View Result Date: 03/25/2024 EXAM: 1 [facility] VIEW XRAY OF THE CHEST 03/25/2024 07:57:06 PM COMPARISON: PA and lateral chest 01/26/2022. CLINICAL HISTORY: ACS R/O ACS R/O FINDINGS: LINES, TUBES AND DEVICES: Overlying telemetry leads. LUNGS AND PLEURA: No focal pulmonary opacity. No pleural effusion. No pneumothorax. HEART AND MEDIASTINUM: Mild aortic tortuosity and atherosclerosis with stable mediastinum. The heart is normal in size. BONES AND SOFT TISSUES: No acute osseous abnormality.  IMPRESSION: 1. No acute chest findings or interval changes. Electronically signed by: Francis Quam MD 03/25/2024 08:52 PM EST RP Workstation: [id number]     Procedures   Medications Ordered in the ED  sodium chloride  0.9 % bolus 1,000 mL (1,000 mLs Intravenous New Bag/Given 03/25/24 2134)  methocarbamol  (ROBAXIN ) tablet 500 mg (500 mg Oral Given 03/25/24 2141)    Clinical Course as of 03/25/24 2244  Tue Mar 25, 2024  2146 Patient evaluated for generalized cramps after URI last week, feeling dehydrated.  Upon arrival  patient is hemodynamically stable and nontoxic-appearing.  His exam is benign.  Lab work so far notable for anion gap of 16 with bicarb of 18 and a leukocytosis of 14.8.  Workup otherwise is reassuring.  Will add on magnesium given his severe intermittent localized cramps and provide IV fluids with Robaxin . [JT]  2237 Remainder of workup reassuring.  Patient discharged with prescription for Robaxin  [JT]    Clinical Course User Index [JT] Donnajean Lynwood DEL, PA-C                                 Medical Decision Making Amount and/or Complexity of Data Reviewed Labs: ordered. Radiology: ordered.  Risk Prescription drug management.   This patient presents to the ED with chief complaint(s) of muscle cramps .  The complaint involves an extensive differential diagnosis and also carries with it a high risk of complications and morbidity.   Pertinent past medical history as listed in HPI  The differential diagnosis includes  No significant electrolyte derangement, no evidence of ACS, respiratory panel is negative Additional history obtained: Records reviewed Care Everywhere/External Records  Disposition:   Patient will be discharged home. The patient has been appropriately medically screened and/or stabilized in the ED. I have low suspicion for any other emergent medical condition which would require further screening, evaluation or treatment in the ED or require inpatient  management. At time of discharge the patient is hemodynamically stable and in no acute distress. I have discussed work-up results and diagnosis with patient and answered all questions. Patient is agreeable with discharge plan. We discussed strict return precautions for returning to the emergency department and they verbalized understanding.     Social Determinants of Health:   none  This note was dictated with voice recognition software.  Despite best efforts at proofreading, errors may have occurred which can change the documentation meaning.       Final diagnoses:  Muscle cramps    ED Discharge Orders          Ordered    methocarbamol  (ROBAXIN ) 500 MG tablet  2 times daily        03/25/24 2236               Donnajean Lynwood DEL, PA-C 03/25/24 2244    Elnor Jayson LABOR, DO 03/31/24 1649  "

## 2024-03-25 NOTE — Discharge Instructions (Signed)
 You were evaluated in the emergency room for cramps.  You are found to be dehydrated.  Your lab work otherwise did not show any significant abnormality.  Prescription for Robaxin , muscle relaxer was sent into your pharmacy.  Please continue to drink plenty of fluids and follow-up with your primary care doctor if your symptoms persist.
